# Patient Record
Sex: Female | Born: 1945
Health system: Southern US, Community
[De-identification: ages and names within clinical notes are randomized; demographics above are authoritative.]

## PROBLEM LIST (undated history)

## (undated) DIAGNOSIS — I10 Essential (primary) hypertension: Secondary | ICD-10-CM

## (undated) DIAGNOSIS — T7840XA Allergy, unspecified, initial encounter: Secondary | ICD-10-CM

## (undated) HISTORY — PX: COLONOSCOPY: SHX174

## (undated) HISTORY — DX: Allergy, unspecified, initial encounter: T78.40XA

## (undated) HISTORY — DX: Essential (primary) hypertension: I10

## (undated) HISTORY — PX: POLYPECTOMY: SHX149

---

## 1981-03-06 HISTORY — PX: TUBAL LIGATION: SHX77

## 2011-09-11 ENCOUNTER — Other Ambulatory Visit: Payer: Self-pay | Admitting: Family Medicine

## 2011-09-11 DIAGNOSIS — Z1231 Encounter for screening mammogram for malignant neoplasm of breast: Secondary | ICD-10-CM

## 2011-09-21 ENCOUNTER — Ambulatory Visit
Admission: RE | Admit: 2011-09-21 | Discharge: 2011-09-21 | Disposition: A | Discharge status: Home / Self Care | Payer: Medicare Other | Source: Ambulatory Visit | Attending: Family Medicine | Admitting: Family Medicine

## 2011-09-21 DIAGNOSIS — Z1231 Encounter for screening mammogram for malignant neoplasm of breast: Secondary | ICD-10-CM

## 2011-09-26 ENCOUNTER — Other Ambulatory Visit: Payer: Self-pay | Admitting: Family Medicine

## 2011-09-26 DIAGNOSIS — R928 Other abnormal and inconclusive findings on diagnostic imaging of breast: Secondary | ICD-10-CM

## 2011-10-09 ENCOUNTER — Other Ambulatory Visit: Payer: Self-pay | Admitting: Family Medicine

## 2011-10-09 ENCOUNTER — Ambulatory Visit
Admission: RE | Admit: 2011-10-09 | Discharge: 2011-10-09 | Disposition: A | Discharge status: Home / Self Care | Payer: Medicare Other | Source: Ambulatory Visit | Attending: Family Medicine | Admitting: Family Medicine

## 2011-10-09 DIAGNOSIS — R928 Other abnormal and inconclusive findings on diagnostic imaging of breast: Secondary | ICD-10-CM

## 2011-10-09 DIAGNOSIS — R921 Mammographic calcification found on diagnostic imaging of breast: Secondary | ICD-10-CM

## 2011-10-17 ENCOUNTER — Ambulatory Visit
Admission: RE | Admit: 2011-10-17 | Discharge: 2011-10-17 | Disposition: A | Discharge status: Home / Self Care | Payer: Medicare Other | Source: Ambulatory Visit | Attending: Family Medicine | Admitting: Family Medicine

## 2011-10-17 ENCOUNTER — Other Ambulatory Visit: Payer: Self-pay | Admitting: Family Medicine

## 2011-10-17 DIAGNOSIS — R921 Mammographic calcification found on diagnostic imaging of breast: Secondary | ICD-10-CM

## 2011-10-17 DIAGNOSIS — R928 Other abnormal and inconclusive findings on diagnostic imaging of breast: Secondary | ICD-10-CM

## 2011-10-17 HISTORY — PX: BREAST BIOPSY: SHX20

## 2011-10-25 ENCOUNTER — Encounter: Payer: Self-pay | Admitting: Gastroenterology

## 2011-12-11 ENCOUNTER — Ambulatory Visit (AMBULATORY_SURGERY_CENTER): Payer: Medicare Other | Admitting: *Deleted

## 2011-12-11 VITALS — Ht 59.5 in | Wt 139.0 lb

## 2011-12-11 DIAGNOSIS — Z1211 Encounter for screening for malignant neoplasm of colon: Secondary | ICD-10-CM

## 2011-12-11 MED ORDER — MOVIPREP 100 G PO SOLR: ORAL | Status: DC

## 2011-12-11 NOTE — Progress Notes (Signed)
Patient denies taking ANY medications!

## 2011-12-25 ENCOUNTER — Ambulatory Visit (AMBULATORY_SURGERY_CENTER): Payer: Medicare Other | Admitting: Gastroenterology

## 2011-12-25 ENCOUNTER — Encounter: Payer: Self-pay | Admitting: Gastroenterology

## 2011-12-25 VITALS — BP 125/67 | HR 65 | Temp 97.1°F | Resp 17 | Ht 59.5 in | Wt 139.0 lb

## 2011-12-25 DIAGNOSIS — D126 Benign neoplasm of colon, unspecified: Secondary | ICD-10-CM

## 2011-12-25 DIAGNOSIS — Z1211 Encounter for screening for malignant neoplasm of colon: Secondary | ICD-10-CM

## 2011-12-25 MED ORDER — SODIUM CHLORIDE 0.9 % IV SOLN: 500.0000 mL | INTRAVENOUS | Status: DC

## 2011-12-25 NOTE — Op Note (Signed)
[facility] [address] [place], [address]   COLONOSCOPY PROCEDURE REPORT  PATIENT: Alice Shannon, Alice Shannon  MR#: [id number] BIRTHDATE: 1945-03-28 , 66  yrs. old GENDER: Female ENDOSCOPIST: Meryl Dare, MD, [facility] REFERRED NF:AOZHY Martin, M.D. PROCEDURE DATE:  12/25/2011 PROCEDURE:   Colonoscopy with snare polypectomy ASA CLASS:   Class II INDICATIONS:average risk screening. MEDICATIONS: MAC sedation, administered by CRNA and propofol (Diprivan) 250mg  IV DESCRIPTION OF PROCEDURE:   After the risks benefits and alternatives of the procedure were thoroughly explained, informed consent was obtained.  A digital rectal exam revealed no abnormalities of the rectum.   The LB CF-[id number]  endoscope was introduced through the anus and advanced to the cecum, which was identified by both the appendix and ileocecal valve. No adverse events experienced.   The quality of the prep was excellent, using MoviPrep  The instrument was then slowly withdrawn as the colon was fully examined.   COLON FINDINGS: A sessile polyp measuring 8 mm in size was found in the ascending colon.  A polypectomy was performed with a cold snare.  The resection was complete and the polyp tissue was completely retrieved.   The colon was otherwise normal.  There was no diverticulosis, inflammation, polyps or cancers unless previously stated.  Retroflexed views revealed small internal hemorrhoids. The time to cecum=1 minutes 56 seconds.  Withdrawal time=7 minutes 47 seconds.  The scope was withdrawn and the procedure completed.  COMPLICATIONS: There were no complications.  ENDOSCOPIC IMPRESSION: 1.   Sessile polyp in the ascending colon; polypectomy was performed with a cold snare 2.   Small internal hemorrhoids  RECOMMENDATIONS: 1.  Await pathology results 2.  Repeat colonoscopy in 5 years if polyp adenomatous; otherwise 10 years   eSigned:  Meryl Dare, MD, [facility] 12/25/2011 9:24  AM

## 2011-12-25 NOTE — Progress Notes (Signed)
Patient did not experience any of the following events: a burn prior to discharge; a fall within the facility; wrong site/side/patient/procedure/implant event; or a hospital transfer or hospital admission upon discharge from the facility. (G8907) Patient did not have preoperative order for IV antibiotic SSI prophylaxis. (G8918)  

## 2011-12-25 NOTE — Patient Instructions (Addendum)
Colon polyp x 1 removed and hemorrhoids seen. See handouts. Resume current medications. Call us with any questions or concerns. Thank you!!  YOU HAD AN ENDOSCOPIC PROCEDURE TODAY AT THE [facility]: Refer to the procedure report that was given to you for any specific questions about what was found during the examination.  If the procedure report does not answer your questions, please call your gastroenterologist to clarify.  If you requested that your care partner not be given the details of your procedure findings, then the procedure report has been included in a sealed envelope for you to review at your convenience later.  YOU SHOULD EXPECT: Some feelings of bloating in the abdomen. Passage of more gas than usual.  Walking can help get rid of the air that was put into your GI tract during the procedure and reduce the bloating. If you had a lower endoscopy (such as a colonoscopy or flexible sigmoidoscopy) you may notice spotting of blood in your stool or on the toilet paper. If you underwent a bowel prep for your procedure, then you may not have a normal bowel movement for a few days.  DIET: Your first meal following the procedure should be a light meal and then it is ok to progress to your normal diet.  A half-sandwich or bowl of soup is an example of a good first meal.  Heavy or fried foods are harder to digest and may make you feel nauseous or bloated.  Likewise meals heavy in dairy and vegetables can cause extra gas to form and this can also increase the bloating.  Drink plenty of fluids but you should avoid alcoholic beverages for 24 hours.  ACTIVITY: Your care partner should take you home directly after the procedure.  You should plan to take it easy, moving slowly for the rest of the day.  You can resume normal activity the day after the procedure however you should NOT DRIVE or use heavy machinery for 24 hours (because of the sedation medicines used during the test).    SYMPTOMS TO  REPORT IMMEDIATELY: A gastroenterologist can be reached at any hour.  During normal business hours, 8:30 AM to 5:00 PM Monday through Friday, call [phone].  After hours and on weekends, please call the GI answering service at [phone] who will take a message and have the physician on call contact you.   Following lower endoscopy (colonoscopy or flexible sigmoidoscopy):  Excessive amounts of blood in the stool  Significant tenderness or worsening of abdominal pains  Swelling of the abdomen that is new, acute  Fever of 100F or higher FOLLOW UP: If any biopsies were taken you will be contacted by phone or by letter within the next 1-3 weeks.  Call your gastroenterologist if you have not heard about the biopsies in 3 weeks.  Our staff will call the home number listed on your records the next business day following your procedure to check on you and address any questions or concerns that you may have at that time regarding the information given to you following your procedure. This is a courtesy call and so if there is no answer at the home number and we have not heard from you through the emergency physician on call, we will assume that you have returned to your regular daily activities without incident.  SIGNATURES/CONFIDENTIALITY: You and/or your care partner have signed paperwork which will be entered into your electronic medical record.  These signatures attest to the fact that that  the information above on your After Visit Summary has been reviewed and is understood.  Full responsibility of the confidentiality of this discharge information lies with you and/or your care-partner.

## 2011-12-26 ENCOUNTER — Telehealth: Payer: Self-pay | Admitting: *Deleted

## 2011-12-26 NOTE — Telephone Encounter (Signed)
  Follow up Call-  Call back number 12/25/2011  Post procedure Call Back phone  # [phone] cell  Permission to leave phone message Yes     Patient questions:  Do you have a fever, pain , or abdominal swelling?no Pain Score  0 *  Have you tolerated food without any problems? yes  Have you been able to return to your normal activities? yes  Do you have any questions about your discharge instructions: Diet   no Medications  no Follow up visit  no  Do you have questions or concerns about your Care? no  Actions: * If pain score is 4 or above: No action needed, pain <4.

## 2011-12-28 ENCOUNTER — Encounter: Payer: Self-pay | Admitting: Gastroenterology

## 2012-12-19 ENCOUNTER — Other Ambulatory Visit: Payer: Self-pay

## 2012-12-19 DIAGNOSIS — Z1231 Encounter for screening mammogram for malignant neoplasm of breast: Secondary | ICD-10-CM

## 2013-04-15 ENCOUNTER — Ambulatory Visit
Admission: RE | Admit: 2013-04-15 | Discharge: 2013-04-15 | Disposition: A | Discharge status: Home / Self Care | Payer: Medicare HMO | Source: Ambulatory Visit

## 2013-04-15 ENCOUNTER — Other Ambulatory Visit: Payer: Self-pay

## 2013-04-15 DIAGNOSIS — Z1231 Encounter for screening mammogram for malignant neoplasm of breast: Secondary | ICD-10-CM

## 2014-03-24 ENCOUNTER — Other Ambulatory Visit: Payer: Self-pay

## 2014-03-24 DIAGNOSIS — Z1231 Encounter for screening mammogram for malignant neoplasm of breast: Secondary | ICD-10-CM

## 2014-04-17 ENCOUNTER — Ambulatory Visit
Admission: RE | Admit: 2014-04-17 | Discharge: 2014-04-17 | Disposition: A | Discharge status: Home / Self Care | Payer: Medicare HMO | Source: Ambulatory Visit

## 2014-04-17 ENCOUNTER — Encounter (INDEPENDENT_AMBULATORY_CARE_PROVIDER_SITE_OTHER): Payer: Self-pay

## 2014-04-17 DIAGNOSIS — Z1231 Encounter for screening mammogram for malignant neoplasm of breast: Secondary | ICD-10-CM

## 2015-04-05 ENCOUNTER — Other Ambulatory Visit: Payer: Self-pay

## 2015-04-05 DIAGNOSIS — Z1231 Encounter for screening mammogram for malignant neoplasm of breast: Secondary | ICD-10-CM

## 2015-04-23 ENCOUNTER — Ambulatory Visit
Admission: RE | Admit: 2015-04-23 | Discharge: 2015-04-23 | Disposition: A | Discharge status: Home / Self Care | Payer: Medicare HMO | Source: Ambulatory Visit

## 2015-04-23 DIAGNOSIS — Z1231 Encounter for screening mammogram for malignant neoplasm of breast: Secondary | ICD-10-CM

## 2016-01-17 ENCOUNTER — Other Ambulatory Visit: Payer: Self-pay | Admitting: Family Medicine

## 2016-01-17 DIAGNOSIS — Z1231 Encounter for screening mammogram for malignant neoplasm of breast: Secondary | ICD-10-CM

## 2016-05-08 ENCOUNTER — Ambulatory Visit
Admission: RE | Admit: 2016-05-08 | Discharge: 2016-05-08 | Disposition: A | Discharge status: Home / Self Care | Payer: Medicare HMO | Source: Ambulatory Visit | Attending: Family Medicine | Admitting: Family Medicine

## 2016-05-08 DIAGNOSIS — Z1231 Encounter for screening mammogram for malignant neoplasm of breast: Secondary | ICD-10-CM

## 2017-01-01 ENCOUNTER — Encounter: Payer: Self-pay | Admitting: Gastroenterology

## 2017-03-30 ENCOUNTER — Other Ambulatory Visit: Payer: Self-pay | Admitting: Family Medicine

## 2017-03-30 DIAGNOSIS — Z1231 Encounter for screening mammogram for malignant neoplasm of breast: Secondary | ICD-10-CM

## 2017-05-10 ENCOUNTER — Ambulatory Visit
Admission: RE | Admit: 2017-05-10 | Discharge: 2017-05-10 | Disposition: A | Discharge status: Home / Self Care | Payer: Medicare HMO | Source: Ambulatory Visit | Attending: Family Medicine | Admitting: Family Medicine

## 2017-05-10 DIAGNOSIS — Z1231 Encounter for screening mammogram for malignant neoplasm of breast: Secondary | ICD-10-CM

## 2017-05-11 ENCOUNTER — Other Ambulatory Visit: Payer: Self-pay | Admitting: Family Medicine

## 2017-05-11 DIAGNOSIS — R921 Mammographic calcification found on diagnostic imaging of breast: Secondary | ICD-10-CM

## 2017-05-15 ENCOUNTER — Ambulatory Visit
Admission: RE | Admit: 2017-05-15 | Discharge: 2017-05-15 | Disposition: A | Discharge status: Home / Self Care | Payer: Medicare HMO | Source: Ambulatory Visit | Attending: Family Medicine | Admitting: Family Medicine

## 2017-05-15 DIAGNOSIS — R921 Mammographic calcification found on diagnostic imaging of breast: Secondary | ICD-10-CM

## 2018-03-14 ENCOUNTER — Other Ambulatory Visit: Payer: Self-pay | Admitting: Internal Medicine

## 2018-03-14 ENCOUNTER — Other Ambulatory Visit (HOSPITAL_COMMUNITY)
Admission: RE | Admit: 2018-03-14 | Discharge: 2018-03-14 | Disposition: A | Discharge status: Home / Self Care | Payer: Medicare Other | Source: Ambulatory Visit | Attending: Internal Medicine | Admitting: Internal Medicine

## 2018-03-14 DIAGNOSIS — Z01411 Encounter for gynecological examination (general) (routine) with abnormal findings: Secondary | ICD-10-CM | POA: Diagnosis present

## 2018-03-14 DIAGNOSIS — E2839 Other primary ovarian failure: Secondary | ICD-10-CM

## 2018-03-19 LAB — CYTOLOGY - PAP
Diagnosis: NEGATIVE
HPV: NOT DETECTED

## 2018-04-01 ENCOUNTER — Other Ambulatory Visit: Payer: Self-pay | Admitting: Internal Medicine

## 2018-04-01 ENCOUNTER — Other Ambulatory Visit: Payer: Self-pay | Admitting: Family Medicine

## 2018-04-01 DIAGNOSIS — Z1231 Encounter for screening mammogram for malignant neoplasm of breast: Secondary | ICD-10-CM

## 2018-04-02 ENCOUNTER — Encounter: Payer: Self-pay | Admitting: Gastroenterology

## 2018-04-17 ENCOUNTER — Other Ambulatory Visit: Payer: Self-pay

## 2018-04-17 ENCOUNTER — Encounter: Payer: Self-pay | Admitting: Gastroenterology

## 2018-04-17 ENCOUNTER — Ambulatory Visit (AMBULATORY_SURGERY_CENTER): Payer: Self-pay | Admitting: *Deleted

## 2018-04-17 VITALS — Ht 59.5 in | Wt 143.8 lb

## 2018-04-17 DIAGNOSIS — Z860100 Personal history of colon polyps, unspecified: Secondary | ICD-10-CM

## 2018-04-17 MED ORDER — SUPREP BOWEL PREP KIT 17.5-3.13-1.6 GM/177ML PO SOLN: 1.0000 | Freq: Once | ORAL | 0 refills | Status: AC

## 2018-04-17 NOTE — Progress Notes (Signed)
No egg or soy allergy known to patient  No issues with past sedation with any surgeries  or procedures, no intubation problems  No diet pills per patient No home 02 use per patient  No blood thinners per patient  Pt denies issues with constipation  No A fib or A flutter  EMMI video  Offered and declined by the patient. 

## 2018-04-26 ENCOUNTER — Other Ambulatory Visit: Payer: Medicare HMO

## 2018-05-01 ENCOUNTER — Ambulatory Visit (AMBULATORY_SURGERY_CENTER): Payer: Medicare Other | Admitting: Gastroenterology

## 2018-05-01 ENCOUNTER — Encounter: Payer: Self-pay | Admitting: Gastroenterology

## 2018-05-01 VITALS — BP 150/75 | HR 72 | Temp 98.6°F | Resp 15 | Ht 59.0 in | Wt 143.0 lb

## 2018-05-01 DIAGNOSIS — D125 Benign neoplasm of sigmoid colon: Secondary | ICD-10-CM

## 2018-05-01 DIAGNOSIS — K635 Polyp of colon: Secondary | ICD-10-CM

## 2018-05-01 DIAGNOSIS — Z86010 Personal history of colonic polyps: Secondary | ICD-10-CM | POA: Diagnosis not present

## 2018-05-01 DIAGNOSIS — D123 Benign neoplasm of transverse colon: Secondary | ICD-10-CM

## 2018-05-01 MED ORDER — SODIUM CHLORIDE 0.9 % IV SOLN: 500.0000 mL | Freq: Once | INTRAVENOUS | Status: DC

## 2018-05-01 NOTE — Patient Instructions (Signed)
Handout on polyps, hemorrhoids, diverticulosis given.   YOU HAD AN ENDOSCOPIC PROCEDURE TODAY AT [facility]:   Refer to the procedure report that was given to you for any specific questions about what was found during the examination.  If the procedure report does not answer your questions, please call your gastroenterologist to clarify.  If you requested that your care partner not be given the details of your procedure findings, then the procedure report has been included in a sealed envelope for you to review at your convenience later.  YOU SHOULD EXPECT: Some feelings of bloating in the abdomen. Passage of more gas than usual.  Walking can help get rid of the air that was put into your GI tract during the procedure and reduce the bloating. If you had a lower endoscopy (such as a colonoscopy or flexible sigmoidoscopy) you may notice spotting of blood in your stool or on the toilet paper. If you underwent a bowel prep for your procedure, you may not have a normal bowel movement for a few days.  Please Note:  You might notice some irritation and congestion in your nose or some drainage.  This is from the oxygen used during your procedure.  There is no need for concern and it should clear up in a day or so.  SYMPTOMS TO REPORT IMMEDIATELY:   Following lower endoscopy (colonoscopy or flexible sigmoidoscopy):  Excessive amounts of blood in the stool  Significant tenderness or worsening of abdominal pains  Swelling of the abdomen that is new, acute  Fever of 100F or higher   For urgent or emergent issues, a gastroenterologist can be reached at any hour by calling [phone].   DIET:  We do recommend a small meal at first, but then you may proceed to your regular diet.  Drink plenty of fluids but you should avoid alcoholic beverages for 24 hours.  ACTIVITY:  You should plan to take it easy for the rest of today and you should NOT DRIVE or use heavy machinery until tomorrow  (because of the sedation medicines used during the test).    FOLLOW UP: Our staff will call the number listed on your records the next business day following your procedure to check on you and address any questions or concerns that you may have regarding the information given to you following your procedure. If we do not reach you, we will leave a message.  However, if you are feeling well and you are not experiencing any problems, there is no need to return our call.  We will assume that you have returned to your regular daily activities without incident.  If any biopsies were taken you will be contacted by phone or by letter within the next 1-3 weeks.  Please call us at [phone] if you have not heard about the biopsies in 3 weeks.    SIGNATURES/CONFIDENTIALITY: You and/or your care partner have signed paperwork which will be entered into your electronic medical record.  These signatures attest to the fact that that the information above on your After Visit Summary has been reviewed and is understood.  Full responsibility of the confidentiality of this discharge information lies with you and/or your care-partner.

## 2018-05-01 NOTE — Progress Notes (Signed)
Pt's states no medical or surgical changes since previsit or office visit. 

## 2018-05-01 NOTE — Progress Notes (Signed)
To PACU, VSS. REport to RN.tb 

## 2018-05-01 NOTE — Op Note (Signed)
[place] Patient Name: Alice Shannon Procedure Date: 05/01/2018 2:00 PM MRN: [id number] Endoscopist: Ladene Artist , MD Age: 73 Referring MD:  Date of Birth: Sep 11, 1945 Gender: Female Account #: [id number] Procedure:                Colonoscopy Indications:              Surveillance: Personal history of adenomatous                            polyps on last colonoscopy > 5 years ago Medicines:                Monitored Anesthesia Care Procedure:                Pre-Anesthesia Assessment:                           - Prior to the procedure, a History and Physical                            was performed, and patient medications and                            allergies were reviewed. The patient's tolerance of                            previous anesthesia was also reviewed. The risks                            and benefits of the procedure and the sedation                            options and risks were discussed with the patient.                            All questions were answered, and informed consent                            was obtained. Prior Anticoagulants: The patient has                            taken no previous anticoagulant or antiplatelet                            agents. ASA Grade Assessment: II - A patient with                            mild systemic disease. After reviewing the risks                            and benefits, the patient was deemed in                            satisfactory condition to undergo the procedure.  After obtaining informed consent, the colonoscope                            was passed under direct vision. Throughout the                            procedure, the patient's blood pressure, pulse, and                            oxygen saturations were monitored continuously. The                            Model CF-HQ190L [id number] scope was introduced                            through the anus and  advanced to the the cecum,                            identified by appendiceal orifice and ileocecal                            valve. The ileocecal valve, appendiceal orifice,                            and rectum were photographed. The quality of the                            bowel preparation was good. The colonoscopy was                            performed without difficulty. The patient tolerated                            the procedure well. Scope In: 2:08:48 PM Scope Out: 2:21:16 PM Scope Withdrawal Time: 0 hours 10 minutes 16 seconds  Total Procedure Duration: 0 hours 12 minutes 28 seconds  Findings:                 The perianal and digital rectal examinations were                            normal.                           Two sessile polyps were found in the sigmoid colon                            and splenic flexure. The polyps were 6 to 7 mm in                            size. These polyps were removed with a cold snare.                            Resection and retrieval were complete.  A few small-mouthed diverticula were found in the                            sigmoid colon.                           Internal hemorrhoids were found during                            retroflexion. The hemorrhoids were small and Grade                            I (internal hemorrhoids that do not prolapse).                           The exam was otherwise without abnormality on                            direct and retroflexion views. Complications:            No immediate complications. Estimated blood loss:                            None. Estimated Blood Loss:     Estimated blood loss: none. Impression:               - Two 6 to 7 mm polyps in the sigmoid colon and at                            the splenic flexure, removed with a cold snare.                            Resected and retrieved.                           - Diverticulosis in the sigmoid colon.                            - Internal hemorrhoids.                           - The examination was otherwise normal on direct                            and retroflexion views. Recommendation:           - Repeat colonoscopy in 5 years for surveillance.                           - Patient has a contact number available for                            emergencies. The signs and symptoms of potential                            delayed complications were discussed with the  patient. Return to normal activities tomorrow.                            Written discharge instructions were provided to the                            patient.                           - Resume previous diet.                           - Continue present medications.                           - Await pathology results. Ladene Artist, MD 05/01/2018 2:24:25 PM This report has been signed electronically.

## 2018-05-02 ENCOUNTER — Encounter: Payer: Self-pay | Admitting: Podiatry

## 2018-05-02 ENCOUNTER — Ambulatory Visit: Payer: Medicare Other | Admitting: Podiatry

## 2018-05-02 ENCOUNTER — Telehealth: Payer: Self-pay | Admitting: *Deleted

## 2018-05-02 VITALS — BP 159/78 | HR 70

## 2018-05-02 DIAGNOSIS — M79672 Pain in left foot: Secondary | ICD-10-CM | POA: Diagnosis not present

## 2018-05-02 DIAGNOSIS — M216X2 Other acquired deformities of left foot: Secondary | ICD-10-CM

## 2018-05-02 NOTE — Telephone Encounter (Signed)
  Follow up Call-  Call back number 05/01/2018  Post procedure Call Back phone  # [phone] cell  Permission to leave phone message Yes  Some recent data might be hidden     Patient questions:  Do you have a fever, pain , or abdominal swelling? No. Pain Score  0 *  Have you tolerated food without any problems? Yes.    Have you been able to return to your normal activities? Yes.    Do you have any questions about your discharge instructions: Diet   No. Medications  No. Follow up visit  No.  Do you have questions or concerns about your Care? No.  Actions: * If pain score is 4 or above: No action needed, pain <4.

## 2018-05-02 NOTE — Patient Instructions (Addendum)
Foot Pain  Many things can cause foot pain. Some common causes are:   An injury.   A sprain.   Arthritis.   Blisters.   Bunions.  Follow these instructions at home:  Pay attention to any changes in your symptoms. Take these actions to help with your discomfort:   If directed, put ice on the affected area:  ? Put ice in a plastic bag.  ? Place a towel between your skin and the bag.  ? Leave the ice on for 15-20 minutes, 3?4 times a day for 2 days.   Take over-the-counter and prescription medicines only as told by your health care provider.   Wear comfortable, supportive shoes that fit you well. Do not wear high heels.   Do not stand or walk for long periods of time.   Do not lift a lot of weight. This can put added pressure on your feet.   Do stretches to relieve foot pain and stiffness as told by your health care provider.   Rub your foot gently.   Keep your feet clean and dry.  Contact a health care provider if:   Your pain does not get better after a few days of self-care.   Your pain gets worse.   You cannot stand on your foot.  Get help right away if:   Your foot is numb or tingling.   Your foot or toes are swollen.   Your foot or toes turn white or blue.   You have warmth and redness along your foot.  This information is not intended to replace advice given to you by your health care provider. Make sure you discuss any questions you have with your health care provider.  Document Released: 03/19/2015 Document Revised: 07/29/2015 Document Reviewed: 03/18/2014  Elsevier Interactive Patient Education  2019 [employer]

## 2018-05-09 ENCOUNTER — Encounter: Payer: Self-pay | Admitting: Gastroenterology

## 2018-05-11 NOTE — Progress Notes (Signed)
Subjective: Alice Shannon presents today referred by Lanice Shirts, MD with cc of painful plantar left foot callus. She states she just retired at age 73 and wore steel toed shoes for work for many years.  Duration of her pain symptoms is about 7 years.  She has been shaving them herself in the past.  Pain is aggravated when weightbearing with and without wearing shoe gear.    Past Medical History:  Diagnosis Date  . Allergy   . Hypertension      There are no active problems to display for this patient.    Past Surgical History:  Procedure Laterality Date  . BREAST BIOPSY Right 10/17/2011   stereo/benign  . COLONOSCOPY    . POLYPECTOMY    . TUBAL LIGATION  1983      Current Outpatient Medications:  .  cetirizine (ZYRTEC) 10 MG tablet, Take 10 mg by mouth daily., Disp: , Rfl:  .  cloNIDine (CATAPRES) 0.1 MG tablet, Take 0.1 mg by mouth 2 (two) times daily., Disp: , Rfl:  .  Vitamin D, Ergocalciferol, (DRISDOL) 1.25 MG (50000 UT) CAPS capsule, TAKE 1 CAPSULE 1 TIME A WEEK FOR 12 WEEKS, Disp: , Rfl:    No Known Allergies   Social History   Occupational History  . Not on file  Tobacco Use  . Smoking status: Never Smoker  . Smokeless tobacco: Never Used  Substance and Sexual Activity  . Alcohol use: No  . Drug use: No  . Sexual activity: Not on file     Family History  Problem Relation Age of Onset  . Colon cancer Neg Hx   . Esophageal cancer Neg Hx   . Rectal cancer Neg Hx   . Stomach cancer Neg Hx   . Colon polyps Neg Hx       There is no immunization history on file for this patient.   Review of systems: Positive Findings in bold print.  Constitutional:  chills, fatigue, fever, sweats, weight change Communication: Optometrist, sign Ecologist, hand writing, iPad/Android device Head: headaches, head injury Eyes: changes in vision, eye pain, glaucoma, cataracts, macular degeneration, diplopia, glare,  light sensitivity, eyeglasses  or contacts, blindness Ears nose mouth throat: Hard of hearing, ringing in ears, deaf, sign language,  vertigo,   nosebleeds,  rhinitis,  cold sores, snoring, swollen glands Cardiovascular: HTN, edema, arrhythmia, pacemaker in place, defibrillator in place,  chest pain/tightness, chronic anticoagulation, blood clot, heart failure Peripheral Vascular: leg cramps, varicose veins, blood clots, lymphedema Respiratory:  difficulty breathing, denies congestion, SOB, wheezing, cough, emphysema Gastrointestinal: change in appetite or weight, abdominal pain, constipation, diarrhea, nausea, vomiting, vomiting blood, change in bowel habits, abdominal pain, jaundice, rectal bleeding, hemorrhoids, Genitourinary:  nocturia,  pain on urination,  blood in urine, Foley catheter, urinary urgency Musculoskeletal: uses mobility aid,  cramping, stiff joints, painful joints, decreased joint motion, fractures, OA, gout Skin: changes in toenails, color change, dryness, itching, mole changes,  rash  Neurological: headaches, numbness in feet, paresthesias in feet, burning in feet, fainting,  seizures, change in speech. denies headaches, memory problems/poor historian, cerebral palsy, weakness, paralysis Endocrine: diabetes, hypothyroidism, hyperthyroidism,  goiter, dry mouth, flushing, heat intolerance,  cold intolerance,  excessive thirst, denies polyuria,  nocturia Hematological:  easy bleeding, excessive bleeding, easy bruising, enlarged lymph nodes, on long term blood thinner, history of past transusions Allergy/immunological:  hives, eczema, frequent infections, multiple drug allergies, seasonal allergies, transplant recipient Psychiatric:  anxiety, depression, mood disorder, suicidal ideations, hallucinations  Objective: Vascular Examination: Capillary refill time immediate x 10 digits.  Dorsalis pedis and posterior tibial pulses present b/l.  Sparse digital hair x 10 digits.  Skin temperature gradient WNL  b/l.  Dermatological Examination: Skin with normal turgor, texture and tone b/l.  Toenails 1-5 b/l well manicured and adequate length.  Hyperkeratotic lesion noted submetatarsal head 3 left foot and subhallux IPJ left hallux.  Musculoskeletal: Muscle strength 5/5 to all LE muscle groups  Plantarflexed metatarsal 3rd left foot  Neurological: Sensation intact with 10 gram monofilament Vibratory sensation intact.  Assessment: 1. Plantarflexed metatarsal left 3rd digit 2. Callus submet head 3 left foot, left hallux 3. Pain in foot   Plan: 1. Discussed diagoses and conservative/surgical treatment options. Lesions pared as a courtesy today.  Literature dispensed on today. 2. Patient to continue soft, supportive shoe gear 3. Patient to report any pedal injuries to medical professional immediately. 4. Follow up 3 months.  5. Patient/POA to call should there be a concern in the interim.

## 2018-05-13 ENCOUNTER — Ambulatory Visit: Payer: Medicare HMO

## 2018-05-29 ENCOUNTER — Ambulatory Visit: Payer: Medicare HMO

## 2018-05-29 ENCOUNTER — Other Ambulatory Visit: Payer: Medicare HMO

## 2018-07-31 ENCOUNTER — Other Ambulatory Visit: Payer: Self-pay

## 2018-07-31 ENCOUNTER — Ambulatory Visit: Payer: Medicare Other

## 2018-08-05 ENCOUNTER — Ambulatory Visit: Payer: Medicare Other | Admitting: Podiatry

## 2018-09-26 ENCOUNTER — Other Ambulatory Visit: Payer: Self-pay

## 2018-09-26 ENCOUNTER — Ambulatory Visit
Admission: RE | Admit: 2018-09-26 | Discharge: 2018-09-26 | Disposition: A | Discharge status: Home / Self Care | Payer: Medicare Other | Source: Ambulatory Visit | Attending: Internal Medicine | Admitting: Internal Medicine

## 2018-09-26 DIAGNOSIS — Z1231 Encounter for screening mammogram for malignant neoplasm of breast: Secondary | ICD-10-CM

## 2018-09-26 DIAGNOSIS — E2839 Other primary ovarian failure: Secondary | ICD-10-CM

## 2018-10-25 ENCOUNTER — Other Ambulatory Visit: Payer: Self-pay

## 2018-10-25 DIAGNOSIS — Z20822 Contact with and (suspected) exposure to covid-19: Secondary | ICD-10-CM

## 2018-10-26 LAB — NOVEL CORONAVIRUS, NAA: SARS-CoV-2, NAA: NOT DETECTED

## 2019-05-06 MED FILL — VIT D2 1.25 MG (50,000 UNIT: 1.25 MG | 56 days supply | Qty: 8 | Fill #0

## 2019-05-09 ENCOUNTER — Other Ambulatory Visit: Payer: Self-pay | Admitting: Internal Medicine

## 2019-05-09 DIAGNOSIS — Z1231 Encounter for screening mammogram for malignant neoplasm of breast: Secondary | ICD-10-CM

## 2019-09-08 ENCOUNTER — Observation Stay (HOSPITAL_COMMUNITY)
Admission: EM | Admit: 2019-09-08 | Discharge: 2019-09-11 | Disposition: A | Discharge status: Home / Self Care | Payer: Medicare Other | Attending: Internal Medicine | Admitting: Internal Medicine

## 2019-09-08 ENCOUNTER — Emergency Department (HOSPITAL_COMMUNITY): Payer: Medicare Other

## 2019-09-08 ENCOUNTER — Other Ambulatory Visit: Payer: Self-pay

## 2019-09-08 DIAGNOSIS — R42 Dizziness and giddiness: Secondary | ICD-10-CM | POA: Diagnosis present

## 2019-09-08 DIAGNOSIS — Z79899 Other long term (current) drug therapy: Secondary | ICD-10-CM | POA: Diagnosis not present

## 2019-09-08 DIAGNOSIS — I10 Essential (primary) hypertension: Secondary | ICD-10-CM | POA: Diagnosis not present

## 2019-09-08 DIAGNOSIS — H8110 Benign paroxysmal vertigo, unspecified ear: Secondary | ICD-10-CM | POA: Diagnosis present

## 2019-09-08 LAB — DIFFERENTIAL
Abs Immature Granulocytes: 0.02 K/uL (ref 0.00–0.07)
Basophils Absolute: 0 K/uL (ref 0.0–0.1)
Basophils Relative: 0 %
Eosinophils Absolute: 0 K/uL (ref 0.0–0.5)
Eosinophils Relative: 0 %
Immature Granulocytes: 0 %
Lymphocytes Relative: 12 %
Lymphs Abs: 1.1 K/uL (ref 0.7–4.0)
Monocytes Absolute: 0.4 K/uL (ref 0.1–1.0)
Monocytes Relative: 4 %
Neutro Abs: 8 K/uL — ABNORMAL HIGH (ref 1.7–7.7)
Neutrophils Relative %: 84 %

## 2019-09-08 LAB — COMPREHENSIVE METABOLIC PANEL WITH GFR
ALT: 23 U/L (ref 0–44)
AST: 28 U/L (ref 15–41)
Albumin: 4.5 g/dL (ref 3.5–5.0)
Alkaline Phosphatase: 89 U/L (ref 38–126)
Anion gap: 11 (ref 5–15)
BUN: 12 mg/dL (ref 8–23)
CO2: 26 mmol/L (ref 22–32)
Calcium: 9.5 mg/dL (ref 8.9–10.3)
Chloride: 105 mmol/L (ref 98–111)
Creatinine, Ser: 0.9 mg/dL (ref 0.44–1.00)
GFR calc Af Amer: >60 mL/min
GFR calc non Af Amer: >60 mL/min
Glucose, Bld: 138 mg/dL — ABNORMAL HIGH (ref 70–99)
Potassium: 3.9 mmol/L (ref 3.5–5.1)
Sodium: 142 mmol/L (ref 135–145)
Total Bilirubin: 0.6 mg/dL (ref 0.3–1.2)
Total Protein: 8.5 g/dL — ABNORMAL HIGH (ref 6.5–8.1)

## 2019-09-08 LAB — CBC
HCT: 44 % (ref 36.0–46.0)
Hemoglobin: 14.5 g/dL (ref 12.0–15.0)
MCH: 30.8 pg (ref 26.0–34.0)
MCHC: 33 g/dL (ref 30.0–36.0)
MCV: 93.4 fL (ref 80.0–100.0)
Platelets: 226 K/uL (ref 150–400)
RBC: 4.71 MIL/uL (ref 3.87–5.11)
RDW: 12.9 % (ref 11.5–15.5)
WBC: 9.6 K/uL (ref 4.0–10.5)
nRBC: 0 % (ref 0.0–0.2)

## 2019-09-08 LAB — PROTIME-INR
INR: 1.1 (ref 0.8–1.2)
Prothrombin Time: 13.6 seconds (ref 11.4–15.2)

## 2019-09-08 LAB — APTT: aPTT: <24 s — ABNORMAL LOW (ref 24–36)

## 2019-09-08 LAB — ETHANOL: Alcohol, Ethyl (B): <10 mg/dL

## 2019-09-08 MED ORDER — MECLIZINE HCL 25 MG PO TABS: 12.5000 mg | ORAL_TABLET | Freq: Once | ORAL | Status: DC

## 2019-09-08 MED ORDER — LORAZEPAM 2 MG/ML IJ SOLN: 0.5000 mg | Freq: Once | INTRAMUSCULAR | Status: DC

## 2019-09-08 MED ORDER — LORAZEPAM 2 MG/ML IJ SOLN
1.0000 mg | Freq: Once | INTRAMUSCULAR | Status: AC
  Administered 2019-09-08: 1 mg via INTRAVENOUS
  Filled 2019-09-08: qty 1

## 2019-09-08 MED ORDER — SODIUM CHLORIDE (PF) 0.9 % IJ SOLN
INTRAMUSCULAR | Status: AC
  Administered 2019-09-08: 10 mL
  Filled 2019-09-08: qty 50

## 2019-09-08 MED ORDER — IOHEXOL 350 MG/ML SOLN
100.0000 mL | Freq: Once | INTRAVENOUS | Status: AC | PRN
  Administered 2019-09-08: 100 mL via INTRAVENOUS

## 2019-09-08 MED ORDER — ONDANSETRON HCL 4 MG/2ML IJ SOLN
4.0000 mg | Freq: Once | INTRAMUSCULAR | Status: AC
  Administered 2019-09-08: 4 mg via INTRAVENOUS
  Filled 2019-09-08: qty 2

## 2019-09-08 NOTE — ED Provider Notes (Signed)
Sudden vertiginous symptoms Code stroke? CTA/MRI ordered/pending Given ativan and zofran - needs re-eval In MRI now, CTA needs to be done H/O HTN only  1:10 - CTA and MRI are negative for cause of dizziness. Recheck: patient reports she is improved, however, symptoms become significant again with simply turning her head side-to-side. Sitting up/ambulation not yet attempted. Will add additional medications. If no significant improvement may consider admission for symptom control given degree of dizziness and age.   2:10 - Patient has received Meclizine and Valium and on recheck continues to be symptomatic. She attempted to ambulate without success. Feel she will require admission for continued treatment until stable for discharge home. Hospitalist paged for admission.     Charlann Lange, PA-C 09/09/19 0212    Drenda Freeze, MD 09/11/19 [phone]

## 2019-09-08 NOTE — ED Triage Notes (Signed)
Patient brought in by EMS for episode of nausea with vomiting and dizziness after eating meal at home. CBG is 141. Patient with history of vertigo, no current meds.

## 2019-09-08 NOTE — ED Provider Notes (Addendum)
[place] DEPT Provider Note   CSN: [id number] Arrival date & time: 09/08/19  2019     History Chief Complaint  Patient presents with  . Nausea  . Emesis    Alice Shannon is a 74 y.o. female w PMHx HTN, presenting to the ED with complaint of sudden onset of room-spinning dizziness that began at 6pm. She has associated nausea and vomiting, worse with position changes. She states she has no vision loss but large difficulty focusing her eyes.  She also has problems balancing.  She denies HA, weakness, numbness.  Patient's husband states she has not had any slurred speech or facial droop.  She states she has never had vertigo before.  The history is provided by the patient.       Past Medical History:  Diagnosis Date  . Allergy   . Hypertension     There are no problems to display for this patient.   Past Surgical History:  Procedure Laterality Date  . BREAST BIOPSY Right 10/17/2011   stereo/benign  . COLONOSCOPY    . POLYPECTOMY    . TUBAL LIGATION  1983     OB History   No obstetric history on file.     Family History  Problem Relation Age of Onset  . Colon cancer Neg Hx   . Esophageal cancer Neg Hx   . Rectal cancer Neg Hx   . Stomach cancer Neg Hx   . Colon polyps Neg Hx     Social History   Tobacco Use  . Smoking status: Never Smoker  . Smokeless tobacco: Never Used  Vaping Use  . Vaping Use: Never used  Substance Use Topics  . Alcohol use: No  . Drug use: No    Home Medications Prior to Admission medications   Medication Sig Start Date End Date Taking? Authorizing Provider  cetirizine (ZYRTEC) 10 MG tablet Take 10 mg by mouth daily.   Yes [provider]  cloNIDine (CATAPRES) 0.1 MG tablet Take 0.1 mg by mouth 2 (two) times daily.    [provider]    Allergies    Patient has no known allergies.  Review of Systems   Review of Systems  All other systems reviewed and are  negative.   Physical Exam Updated Vital Signs BP (!) 167/82   Pulse 68   Temp 97.7 F (36.5 C)   Resp 20   Ht 4\' 11"  (1.499 m)   Wt 63.5 kg   SpO2 100%   BMI 28.28 kg/m   Physical Exam Vitals and nursing note reviewed.  Constitutional:      Appearance: She is well-developed.  HENT:     Head: Normocephalic and atraumatic.  Eyes:     Conjunctiva/sclera: Conjunctivae normal.  Cardiovascular:     Rate and Rhythm: Normal rate and regular rhythm.  Pulmonary:     Effort: Pulmonary effort is normal. No respiratory distress.     Breath sounds: Normal breath sounds.  Abdominal:     Palpations: Abdomen is soft.  Skin:    General: Skin is warm.  Neurological:     Mental Status: She is alert.     Comments: Patient is laying to the left side with her eyes closed.  Mental Status:  Alert, oriented, thought content appropriate, able to give a coherent history. Speech fluent without evidence of aphasia. Able to follow 2 step commands without difficulty.  Cranial Nerves:  II:  Peripheral visual fields grossly normal, pupils equal,  round, reactive to light III,IV, VI: ptosis not present, extra-ocular motions intact bilaterally - has resting horizontal nystagmus that persists.   V,VII: smile symmetric, facial light touch sensation equal VIII: hearing grossly normal to voice  X: uvula elevates symmetrically  XI: bilateral shoulder shrug symmetric and strong XII: midline tongue extension without fassiculations Motor:  Normal tone. 5/5 strength in upper and lower extremities bilaterally including strong and equal grip strength and dorsiflexion/plantar flexion Sensory: grossly normal in all extremities.  Cerebellar: normal finger-to-nose with bilateral upper extremities CV: distal pulses palpable throughout   Gait and balance deferred given patient's significant dizziness and large nausea with any position change.  Psychiatric:        Behavior: Behavior normal.     ED Results /  Procedures / Treatments   Labs (all labs ordered are listed, but only abnormal results are displayed) Labs Reviewed  APTT - Abnormal; Notable for the following components:      Result Value   aPTT <24 (*)    All other components within normal limits  DIFFERENTIAL - Abnormal; Notable for the following components:   Neutro Abs 8.0 (*)    All other components within normal limits  COMPREHENSIVE METABOLIC PANEL - Abnormal; Notable for the following components:   Glucose, Bld 138 (*)    Total Protein 8.5 (*)    All other components within normal limits  ETHANOL  PROTIME-INR  CBC  RAPID URINE DRUG SCREEN, HOSP PERFORMED  URINALYSIS, ROUTINE W REFLEX MICROSCOPIC  I-STAT CHEM 8, ED    EKG None  Radiology No results found.  Procedures Procedures (including critical care time)  Medications Ordered in ED Medications  ondansetron (ZOFRAN) injection 4 mg (4 mg Intravenous Given 09/08/19 2157)  LORazepam (ATIVAN) injection 1 mg (1 mg Intravenous Given 09/08/19 2157)  sodium chloride (PF) 0.9 % injection (10 mLs  Given 09/08/19 2250)    ED Course  I have reviewed the triage vital signs and the nursing notes.  Pertinent labs & imaging results that were available during my care of the patient were reviewed by me and considered in my medical decision making (see chart for details).  Clinical Course as of Sep 08 2303  Mon Sep 08, 2019  2100 Pt evaluated. Concern for vertiginous symptoms as central cause. Pt discussed with Dr. Darl Householder, who evaluated pt. CTA head and neck ordered, MRI brain to evaluate posterior circulation.    [JR]    Clinical Course User Index [JR] Harini Dearmond, [place] N, PA-C   MDM Rules/Calculators/A&P                          Patient presenting to the ED with sudden onset of vertiginous symptoms that began at 6 PM this evening.  She has room spinning dizziness, nausea, vomiting, worse with position changes.  She also unable to stand due to balance problem.  On exam, she has  a resting and nystagmus present.  Gait deferred due to dizziness however she has no other obvious focal neuro deficits.  Concern for posterior circulation stroke, differential also includes peripheral vertigo. Dr. Darl Householder evaluated patient as well, patient sent to MRI prior to CTA due to MRI technologists leaving shortly.  Care assumed at shift change by PA Upstill, pending MRI, CT imaging and lab results.  If imaging is all negative, patient will need reevaluation for symptom improvement, will need to ambulate.  Final Clinical Impression(s) / ED Diagnoses Final diagnoses:  None  Rx / DC Orders ED Discharge Orders    None       Zakry Caso, [place] N, PA-C 09/08/19 2322    Rockwell Zentz, [place] N, PA-C 09/08/19 2324    Drenda Freeze, MD 09/09/19 [phone]

## 2019-09-09 ENCOUNTER — Encounter (HOSPITAL_COMMUNITY): Payer: Self-pay | Admitting: Internal Medicine

## 2019-09-09 DIAGNOSIS — H8110 Benign paroxysmal vertigo, unspecified ear: Secondary | ICD-10-CM

## 2019-09-09 LAB — URINALYSIS, ROUTINE W REFLEX MICROSCOPIC
Bilirubin Urine: NEGATIVE
Glucose, UA: NEGATIVE mg/dL
Hgb urine dipstick: NEGATIVE
Ketones, ur: 20 mg/dL — AB
Leukocytes,Ua: NEGATIVE
Nitrite: NEGATIVE
Protein, ur: NEGATIVE mg/dL
Specific Gravity, Urine: >1.046 — ABNORMAL HIGH (ref 1.005–1.030)
pH: 7 (ref 5.0–8.0)

## 2019-09-09 LAB — RAPID URINE DRUG SCREEN, HOSP PERFORMED
Amphetamines: NOT DETECTED
Barbiturates: NOT DETECTED
Benzodiazepines: NOT DETECTED
Cocaine: NOT DETECTED
Opiates: NOT DETECTED
Tetrahydrocannabinol: NOT DETECTED

## 2019-09-09 MED ORDER — MECLIZINE HCL 25 MG PO TABS
25.0000 mg | ORAL_TABLET | Freq: Once | ORAL | Status: AC
  Administered 2019-09-09: 25 mg via ORAL
  Filled 2019-09-09: qty 1

## 2019-09-09 MED ORDER — ACETAMINOPHEN 650 MG RE SUPP: 650.0000 mg | Freq: Four times a day (QID) | RECTAL | Status: DC | PRN

## 2019-09-09 MED ORDER — ONDANSETRON HCL 4 MG/2ML IJ SOLN: 4.0000 mg | Freq: Four times a day (QID) | INTRAMUSCULAR | Status: DC | PRN

## 2019-09-09 MED ORDER — MECLIZINE HCL 25 MG PO TABS
25.0000 mg | ORAL_TABLET | Freq: Three times a day (TID) | ORAL | Status: DC | PRN
  Administered 2019-09-10 (×2): 25 mg via ORAL
  Filled 2019-09-09 (×2): qty 1

## 2019-09-09 MED ORDER — DIAZEPAM 2 MG PO TABS: 2.0000 mg | ORAL_TABLET | Freq: Four times a day (QID) | ORAL | Status: DC | PRN

## 2019-09-09 MED ORDER — ONDANSETRON HCL 4 MG PO TABS: 4.0000 mg | ORAL_TABLET | Freq: Four times a day (QID) | ORAL | Status: DC | PRN

## 2019-09-09 MED ORDER — ENOXAPARIN SODIUM 40 MG/0.4ML ~~LOC~~ SOLN
40.0000 mg | SUBCUTANEOUS | Status: DC
  Administered 2019-09-09 – 2019-09-11 (×3): 40 mg via SUBCUTANEOUS
  Filled 2019-09-09 (×3): qty 0.4

## 2019-09-09 MED ORDER — DIAZEPAM 5 MG/ML IJ SOLN
2.5000 mg | Freq: Once | INTRAMUSCULAR | Status: AC
  Administered 2019-09-09: 2.5 mg via INTRAVENOUS
  Filled 2019-09-09: qty 2

## 2019-09-09 MED ORDER — ACETAMINOPHEN 325 MG PO TABS: 650.0000 mg | ORAL_TABLET | Freq: Four times a day (QID) | ORAL | Status: DC | PRN

## 2019-09-09 NOTE — Evaluation (Signed)
Physical Therapy Evaluation Patient Details Name: Alice Shannon MRN: [id number] DOB: 10-25-1945 Today's Date: 09/09/2019   History of Present Illness  74 y.o. female with medical history significant of HTN.  Pt presents to the ED with c/o N/V and dizziness  Clinical Impression  Pt admitted with above diagnosis.  Pt currently with functional limitations due to the deficits listed below (see PT Problem List). Pt will benefit from skilled PT to increase their independence and safety with mobility to allow discharge to the venue listed below.  Pt presents with right beating nystagmus at rest which does not change direction with gaze.  Pt also with significant left lateral lean with standing and ambulating short distance.  Will return to check head thrust as pt may have L peripheral vestibular hypofunction and may also attempt to check for concomitant BPPV (left).     09/09/19 1411  Vestibular Assessment  General Observation Pt reports spontanous spinning sensation started yesterday while she was watching TV.  Pt denies falls or hitting head.  Symptom Behavior  Type of Dizziness  Imbalance;Spinning  Frequency of Dizziness increases with movement  Symptom Nature Constant  Aggravating Factors Supine to sit;Moving eyes;Activity in general;Sit to stand  Relieving Factors Head stationary;Closing eyes;Rest  Progression of Symptoms Better (N/V yesterday, none today)  Oculomotor Exam  Oculomotor Alignment Normal  Spontaneous Right beating nystagmus  Gaze-induced  Right beating nystagmus with L gaze;Right beating nystagmus with R gaze     Follow Up Recommendations Supervision/Assistance - 24 hour;Outpatient PT (OP neuro)    Equipment Recommendations  Rolling walker with 5" wheels    Recommendations for Other Services       Precautions / Restrictions Precautions Precautions: Fall      Mobility  Bed Mobility Overal bed mobility: Needs Assistance Bed Mobility: Supine to  Sit;Sit to Supine     Supine to sit: Min assist Sit to supine: Min guard   General bed mobility comments: assist for trunk due to dizziness  Transfers Overall transfer level: Needs assistance Equipment used: Rolling walker (2 wheeled) Transfers: Sit to/from Stand Sit to Stand: Mod assist         General transfer comment: verbal cues for hand placement, assist for left lateral lean and stability  Ambulation/Gait Ambulation/Gait assistance: Min assist Gait Distance (Feet): 8 Feet Assistive device: Rolling walker (2 wheeled) Gait Pattern/deviations: Step-through pattern;Decreased weight shift to right     General Gait Details: significant left lateral lean requiring assist for stability and RW, only ambulated to/from bathroom per pt request to use bathroom (no bucket in BSC, notified nursing)  Stairs            Wheelchair Mobility    Modified Rankin (Stroke Patients Only)       Balance Overall balance assessment: Needs assistance       Postural control: Left lateral lean Standing balance support: Bilateral upper extremity supported Standing balance-Leahy Scale: Poor Standing balance comment: requiring assist                             Pertinent Vitals/Pain Pain Assessment: No/denies pain    Home Living Family/patient expects to be discharged to:: Private residence Living Arrangements: Spouse/significant other   Type of Home: House         Home Equipment: None      Prior Function Level of Independence: Independent               Hand Dominance  Extremity/Trunk Assessment        Lower Extremity Assessment Lower Extremity Assessment: Overall WFL for tasks assessed    Cervical / Trunk Assessment Cervical / Trunk Assessment: Normal  Communication   Communication: No difficulties  Cognition Arousal/Alertness: Awake/alert Behavior During Therapy: WFL for tasks assessed/performed Overall Cognitive Status: Within  Functional Limits for tasks assessed                                        General Comments      Exercises     Assessment/Plan    PT Assessment Patient needs continued PT services  PT Problem List Decreased balance;Decreased knowledge of use of DME;Decreased mobility       PT Treatment Interventions Gait training;DME instruction;Therapeutic exercise;Balance training;Functional mobility training;Therapeutic activities;Patient/family education (compensation strategies)    PT Goals (Current goals can be found in the Care Plan section)  Acute Rehab PT Goals PT Goal Formulation: With patient Time For Goal Achievement: 09/16/19 Potential to Achieve Goals: Good    Frequency Min 3X/week   Barriers to discharge        Co-evaluation               AM-PAC PT "6 Clicks" Mobility  Outcome Measure Help needed turning from your back to your side while in a flat bed without using bedrails?: A Little Help needed moving from lying on your back to sitting on the side of a flat bed without using bedrails?: A Little Help needed moving to and from a bed to a chair (including a wheelchair)?: A Lot Help needed standing up from a chair using your arms (e.g., wheelchair or bedside chair)?: A Lot Help needed to walk in hospital room?: A Lot Help needed climbing 3-5 steps with a railing? : Total 6 Click Score: 13    End of Session Equipment Utilized During Treatment: Gait belt Activity Tolerance: Patient tolerated treatment well Patient left: in bed;with call bell/phone within reach;with bed alarm set;with family/visitor present   PT Visit Diagnosis: Other abnormalities of gait and mobility (R26.89)    Time: 6151-8343 PT Time Calculation (min) (ACUTE ONLY): 25 min   Charges:   PT Evaluation $PT Eval Moderate Complexity: 1 Mod        Kati PT, DPT Acute Rehabilitation Services Pager: [phone] Office: [phone]  York Ram E 09/09/2019, 3:53 PM

## 2019-09-09 NOTE — Progress Notes (Signed)
Physical Therapy Treatment Patient Details Name: Alice Shannon MRN: [id number] DOB: 1945/12/05 Today's Date: 09/09/2019    History of Present Illness 74 y.o. female with medical history significant of HTN.  Pt presents to the ED with c/o N/V and dizziness    PT Comments    Pt keeping neck stiff with attempt to perform head thrusts so repositioned for performing [employer].  Pt with constant right beating nystagmus (like at rest however less prominent appearing then this morning), no rotary component and no symptoms reported with either right or left side.  Pt then returned to sitting and attempted head thrusts again, and pt able to perform refixation to right and left however only with cues (also limited cervical neck motion as well ?guarding).  Pt reports sinus issues lately.  Pt also reports improvement in symptoms with looking to right.  Will attempt more mobility and compensation strategies next session.   Follow Up Recommendations  Supervision/Assistance - 24 hour;Outpatient PT (OP neuro)     Equipment Recommendations  Rolling walker with 5" wheels    Recommendations for Other Services       Precautions / Restrictions Precautions Precautions: Fall    Mobility  Bed Mobility Overal bed mobility: Needs Assistance Bed Mobility: Supine to Sit     Supine to sit: Min guard Sit to supine: Min guard   General bed mobility comments: increased time and effort  Transfers Overall transfer level: Needs assistance Equipment used: 1 person hand held assist Transfers: Sit to/from Stand Sit to Stand: Min assist         General transfer comment: verbal cues for hand placement, assist for left lateral lean and stability, stand and few steps up [facility] for repositioning  Ambulation/Gait Ambulation/Gait assistance: Min assist Gait Distance (Feet): 8 Feet Assistive device: Rolling walker (2 wheeled) Gait Pattern/deviations: Step-through pattern;Decreased weight shift to  right     General Gait Details: significant left lateral lean requiring assist for stability and RW, only ambulated to/from bathroom per pt request to use bathroom (no bucket in BSC, notified nursing)   Stairs             Wheelchair Mobility    Modified Rankin (Stroke Patients Only)       Balance Overall balance assessment: Needs assistance       Postural control: Left lateral lean Standing balance support: Bilateral upper extremity supported Standing balance-Leahy Scale: Poor Standing balance comment: requiring assist                            Cognition Arousal/Alertness: Awake/alert Behavior During Therapy: WFL for tasks assessed/performed Overall Cognitive Status: Within Functional Limits for tasks assessed                                        Exercises      General Comments        Pertinent Vitals/Pain Pain Assessment: No/denies pain    Home Living Family/patient expects to be discharged to:: Private residence Living Arrangements: Spouse/significant other   Type of Home: House       Home Equipment: None      Prior Function Level of Independence: Independent          PT Goals (current goals can now be found in the care plan section) Acute Rehab PT Goals PT Goal Formulation: With patient Time  For Goal Achievement: 09/16/19 Potential to Achieve Goals: Good Additional Goals Additional Goal #1: Pt will demonstrate compensation strategies during mobility without cues. Progress towards PT goals: Progressing toward goals    Frequency    Min 3X/week      PT Plan Current plan remains appropriate    Co-evaluation              AM-PAC PT "6 Clicks" Mobility   Outcome Measure  Help needed turning from your back to your side while in a flat bed without using bedrails?: A Little Help needed moving from lying on your back to sitting on the side of a flat bed without using bedrails?: A Little Help needed  moving to and from a bed to a chair (including a wheelchair)?: A Lot Help needed standing up from a chair using your arms (e.g., wheelchair or bedside chair)?: A Lot Help needed to walk in hospital room?: A Lot Help needed climbing 3-5 steps with a railing? : Total 6 Click Score: 13    End of Session Equipment Utilized During Treatment: Gait belt Activity Tolerance: Patient tolerated treatment well Patient left: in bed;with call bell/phone within reach;Other (comment) (called secretary after session to reapply bed alarm (forgot when leaving room))   PT Visit Diagnosis: Other abnormalities of gait and mobility (R26.89)     Time: 4037-0964 PT Time Calculation (min) (ACUTE ONLY): 19 min  Charges:  $Physical Performance Test: 8-22 mins                     Arlyce Dice, DPT Acute Rehabilitation Services Pager: [phone] Office: [phone]  York Ram E 09/09/2019, 5:08 PM

## 2019-09-09 NOTE — Progress Notes (Signed)
Patient seen and examined personally, I reviewed the chart, history and physical and admission note, done by admitting physician this morning and agree with the same with following addendum.  Please refer to the morning admission note for more detailed plan of care.  Briefly,  2-year female with history of hypertension presented to the ED with complaint nausea vomiting and dizziness that started suddenly at 6 PM 09/08/2019. She was seen in the ED had MRI that was negative for acute finding and also CT. ED try to ambulate her but could not do so patient was admitted.  Morning patient complains of ongoing dizziness especially getting up. No nausea vomiting. Denies chest pain numbness tingling or focal weakness.  On exam negative cerebellar sign but with nystagmus. PT eval done patient was not able to do well with PT.  He remained symptomatic.  Dizzines: suspecting BPPV no evidence of stroke in the MRI.  We will continue meclizine if remains symptomatic we will add Valium.  Continue PT OT.

## 2019-09-09 NOTE — H&P (Signed)
History and Physical    Alice Shannon NTI:[id number] DOB: Jul 15, 1945 DOA: 09/08/2019  PCP: Leeroy Cha, MD  Patient coming from: Home  I have personally briefly reviewed patient's old medical records in [place]  Chief Complaint: Vertigo  HPI: Alice Shannon is a 74 y.o. female with medical history significant of HTN.  Pt presents to the ED with c/o N/V and dizziness.  Onset suddenly at 6pm.  Difficulty focusing eyes.  Problems with balance.  Describes quality as "room spinning sensation" symptoms worse with change in position.   ED Course: MRI brain neg  Does have diminutive vertebrobasilar system, but no acute findings on CTA.  Symptoms persist and pt unable to ambulate despite valium and meclizine in ED.   Review of Systems: As per HPI, otherwise all review of systems negative.  Past Medical History:  Diagnosis Date   Allergy    Hypertension     Past Surgical History:  Procedure Laterality Date   BREAST BIOPSY Right 10/17/2011   stereo/benign   COLONOSCOPY     POLYPECTOMY     TUBAL LIGATION  1983     reports that she has never smoked. She has never used smokeless tobacco. She reports that she does not drink alcohol and does not use drugs.  No Known Allergies  Family History  Problem Relation Age of Onset   Colon cancer Neg Hx    Esophageal cancer Neg Hx    Rectal cancer Neg Hx    Stomach cancer Neg Hx    Colon polyps Neg Hx      Prior to Admission medications   Medication Sig Start Date End Date Taking? Authorizing Provider  cetirizine (ZYRTEC) 10 MG tablet Take 10 mg by mouth daily.   Yes [provider]  cloNIDine (CATAPRES) 0.1 MG tablet Take 0.1 mg by mouth 2 (two) times daily.    [provider]    Physical Exam: Vitals:   09/08/19 2100 09/08/19 2145 09/08/19 2345 09/09/19 0115  BP: (!) 159/86 (!) 167/82 (!) 151/78 (!) 143/82  Pulse: 94 68 87 79  Resp: 19 20 17 15   Temp:       SpO2: 100% 100% 100% 100%  Weight: 63.5 kg     Height: 4\' 11"  (1.499 m)       Constitutional: NAD, calm, comfortable Eyes: PERRL, lids and conjunctivae normal ENMT: Mucous membranes are moist. Posterior pharynx clear of any exudate or lesions.Normal dentition.  Neck: normal, supple, no masses, no thyromegaly Respiratory: clear to auscultation bilaterally, no wheezing, no crackles. Normal respiratory effort. No accessory muscle use.  Cardiovascular: Regular rate and rhythm, no murmurs / rubs / gallops. No extremity edema. 2+ pedal pulses. No carotid bruits.  Abdomen: no tenderness, no masses palpated. No hepatosplenomegaly. Bowel sounds positive.  Musculoskeletal: no clubbing / cyanosis. No joint deformity upper and lower extremities. Good ROM, no contractures. Normal muscle tone.  Skin: no rashes, lesions, ulcers. No induration Neurologic: CN 2-12 grossly intact. Sensation intact, DTR normal. Strength 5/5 in all 4. Nystagmus present. Psychiatric: Normal judgment and insight. Alert and oriented x 3. Normal mood.    Labs on Admission: I have personally reviewed following labs and imaging studies  CBC: Recent Labs  Lab 09/08/19 2117  WBC 9.6  NEUTROABS 8.0*  HGB 14.5  HCT 44.0  MCV 93.4  PLT 867   Basic Metabolic Panel: Recent Labs  Lab 09/08/19 2117  NA 142  K 3.9  CL 105  CO2 26  GLUCOSE  138*  BUN 12  CREATININE 0.90  CALCIUM 9.5   GFR: Estimated Creatinine Clearance: 43.1 mL/min (by C-G formula based on SCr of 0.9 mg/dL). Liver Function Tests: Recent Labs  Lab 09/08/19 2117  AST 28  ALT 23  ALKPHOS 89  BILITOT 0.6  PROT 8.5*  ALBUMIN 4.5   No results for input(s): LIPASE, AMYLASE in the last 168 hours. No results for input(s): AMMONIA in the last 168 hours. Coagulation Profile: Recent Labs  Lab 09/08/19 2117  INR 1.1   Cardiac Enzymes: No results for input(s): CKTOTAL, CKMB, CKMBINDEX, TROPONINI in the last 168 hours. BNP (last 3 results) No  results for input(s): PROBNP in the last 8760 hours. HbA1C: No results for input(s): HGBA1C in the last 72 hours. CBG: No results for input(s): GLUCAP in the last 168 hours. Lipid Profile: No results for input(s): CHOL, HDL, LDLCALC, TRIG, CHOLHDL, LDLDIRECT in the last 72 hours. Thyroid Function Tests: No results for input(s): TSH, T4TOTAL, FREET4, T3FREE, THYROIDAB in the last 72 hours. Anemia Panel: No results for input(s): VITAMINB12, FOLATE, FERRITIN, TIBC, IRON, RETICCTPCT in the last 72 hours. Urine analysis: No results found for: COLORURINE, APPEARANCEUR, [place], [place], GLUCOSEU, HGBUR, BILIRUBINUR, KETONESUR, PROTEINUR, UROBILINOGEN, NITRITE, LEUKOCYTESUR  Radiological Exams on Admission: CT Angio Head W/Cm &/Or Wo Cm  Result Date: 09/09/2019 CLINICAL DATA:  Initial evaluation for acute vertigo. EXAM: CT ANGIOGRAPHY HEAD AND NECK TECHNIQUE: Multidetector CT imaging of the head and neck was performed using the standard protocol during bolus administration of intravenous contrast. Multiplanar CT image reconstructions and MIPs were obtained to evaluate the vascular anatomy. Carotid stenosis measurements (when applicable) are obtained utilizing NASCET criteria, using the distal internal carotid diameter as the denominator. CONTRAST:  169mL OMNIPAQUE IOHEXOL 350 MG/ML SOLN COMPARISON:  Prior brain MRI from earlier same day. FINDINGS: CT HEAD FINDINGS Brain: Cerebral volume within normal limits for patient age. Mild chronic microvascular ischemic disease. No evidence for acute intracranial hemorrhage. No findings to suggest acute large vessel territory infarct. No mass lesion, midline shift, or mass effect. Ventricles are normal in size without evidence for hydrocephalus. No extra-axial fluid collection identified. Vascular: No hyperdense vessel identified. Skull: Scalp soft tissues demonstrate no acute abnormality. Calvarium intact. Sinuses/Orbits: Globes and orbital soft tissues within normal  limits. Visualized paranasal sinuses are clear. No mastoid effusion. CTA NECK FINDINGS Aortic arch: Visualized aortic arch of normal caliber with normal 3 vessel morphology. No hemodynamically significant stenosis seen about the origin of the great vessels. Right carotid system: Right common carotid artery widely patent from its origin to the bifurcation without stenosis. Mild atheromatous irregularity about the right bifurcation without flow-limiting stenosis. Right ICA widely patent distally without stenosis, dissection or occlusion. Left carotid system: Left common carotid artery widely patent to the bifurcation without stenosis. Mild atheromatous irregularity about the left bifurcation/proximal left ICA without flow-limiting stenosis. Left ICA widely patent distally without stenosis, dissection or occlusion. Vertebral arteries: Both vertebral arteries arise from the subclavian arteries. Strongly dominant left vertebral artery with diffusely hypoplastic right vertebral artery noted. No proximal subclavian artery stenosis. Vertebral arteries widely patent within the neck without stenosis, dissection or occlusion. Skeleton: No acute osseous abnormality. No discrete or worrisome osseous lesions. Prominent bulky anterior osteophytic spurring noted anteriorly at the C1-2 levels. Other neck: No other acute soft tissue abnormality within the neck. No mass lesion or adenopathy. Few small subcentimeter thyroid nodules measuring up to 5 mm noted, felt to be of doubtful clinical significance given size and patient age.  No follow-up imaging recommended. Upper chest: Visualized upper chest demonstrates no acute finding. Review of the MIP images confirms the above findings CTA HEAD FINDINGS Anterior circulation: Both internal carotid arteries widely patent to the termini without stenosis or other abnormality. A1 segments patent. Normal anterior communicating artery complex. Anterior cerebral arteries widely patent without  stenosis. No M1 stenosis or occlusion. Normal MCA bifurcations. Distal MCA branches well perfused and symmetric. Posterior circulation: Dominant left vertebral artery patent to the vertebrobasilar junction without stenosis. Patent left PICA. Hypoplastic right vertebral artery largely terminates in PICA. Basilar diffusely diminutive but patent to its distal aspect without stenosis. Superior cerebral arteries patent bilaterally. Fetal type origin of both PCAs supplied via a robust bilateral posterior communicating arteries. Both PCAs well perfused to their distal aspects. Venous sinuses: Patent. Anatomic variants: Fetal type origin of the PCAs with overall diminutive vertebrobasilar system. No intracranial aneurysm. Review of the MIP images confirms the above findings IMPRESSION: CT HEAD IMPRESSION: 1. No acute intracranial abnormality. 2. Mild chronic microvascular ischemic disease for age. CTA HEAD AND NECK IMPRESSION: 1. Negative CTA of the head and neck. No large vessel occlusion, hemodynamically significant stenosis, or other acute vascular abnormality. 2. Mild atheromatous irregularity about the carotid bifurcations without hemodynamically significant stenosis. 3. Fetal type origin of the PCAs with overall diminutive vertebrobasilar system. Electronically Signed   By: Jeannine Boga M.D.   On: 09/09/2019 00:21   CT Angio Neck W and/or Wo Contrast  Result Date: 09/09/2019 CLINICAL DATA:  Initial evaluation for acute vertigo. EXAM: CT ANGIOGRAPHY HEAD AND NECK TECHNIQUE: Multidetector CT imaging of the head and neck was performed using the standard protocol during bolus administration of intravenous contrast. Multiplanar CT image reconstructions and MIPs were obtained to evaluate the vascular anatomy. Carotid stenosis measurements (when applicable) are obtained utilizing NASCET criteria, using the distal internal carotid diameter as the denominator. CONTRAST:  122mL OMNIPAQUE IOHEXOL 350 MG/ML SOLN  COMPARISON:  Prior brain MRI from earlier same day. FINDINGS: CT HEAD FINDINGS Brain: Cerebral volume within normal limits for patient age. Mild chronic microvascular ischemic disease. No evidence for acute intracranial hemorrhage. No findings to suggest acute large vessel territory infarct. No mass lesion, midline shift, or mass effect. Ventricles are normal in size without evidence for hydrocephalus. No extra-axial fluid collection identified. Vascular: No hyperdense vessel identified. Skull: Scalp soft tissues demonstrate no acute abnormality. Calvarium intact. Sinuses/Orbits: Globes and orbital soft tissues within normal limits. Visualized paranasal sinuses are clear. No mastoid effusion. CTA NECK FINDINGS Aortic arch: Visualized aortic arch of normal caliber with normal 3 vessel morphology. No hemodynamically significant stenosis seen about the origin of the great vessels. Right carotid system: Right common carotid artery widely patent from its origin to the bifurcation without stenosis. Mild atheromatous irregularity about the right bifurcation without flow-limiting stenosis. Right ICA widely patent distally without stenosis, dissection or occlusion. Left carotid system: Left common carotid artery widely patent to the bifurcation without stenosis. Mild atheromatous irregularity about the left bifurcation/proximal left ICA without flow-limiting stenosis. Left ICA widely patent distally without stenosis, dissection or occlusion. Vertebral arteries: Both vertebral arteries arise from the subclavian arteries. Strongly dominant left vertebral artery with diffusely hypoplastic right vertebral artery noted. No proximal subclavian artery stenosis. Vertebral arteries widely patent within the neck without stenosis, dissection or occlusion. Skeleton: No acute osseous abnormality. No discrete or worrisome osseous lesions. Prominent bulky anterior osteophytic spurring noted anteriorly at the C1-2 levels. Other neck: No  other acute soft tissue abnormality within the  neck. No mass lesion or adenopathy. Few small subcentimeter thyroid nodules measuring up to 5 mm noted, felt to be of doubtful clinical significance given size and patient age. No follow-up imaging recommended. Upper chest: Visualized upper chest demonstrates no acute finding. Review of the MIP images confirms the above findings CTA HEAD FINDINGS Anterior circulation: Both internal carotid arteries widely patent to the termini without stenosis or other abnormality. A1 segments patent. Normal anterior communicating artery complex. Anterior cerebral arteries widely patent without stenosis. No M1 stenosis or occlusion. Normal MCA bifurcations. Distal MCA branches well perfused and symmetric. Posterior circulation: Dominant left vertebral artery patent to the vertebrobasilar junction without stenosis. Patent left PICA. Hypoplastic right vertebral artery largely terminates in PICA. Basilar diffusely diminutive but patent to its distal aspect without stenosis. Superior cerebral arteries patent bilaterally. Fetal type origin of both PCAs supplied via a robust bilateral posterior communicating arteries. Both PCAs well perfused to their distal aspects. Venous sinuses: Patent. Anatomic variants: Fetal type origin of the PCAs with overall diminutive vertebrobasilar system. No intracranial aneurysm. Review of the MIP images confirms the above findings IMPRESSION: CT HEAD IMPRESSION: 1. No acute intracranial abnormality. 2. Mild chronic microvascular ischemic disease for age. CTA HEAD AND NECK IMPRESSION: 1. Negative CTA of the head and neck. No large vessel occlusion, hemodynamically significant stenosis, or other acute vascular abnormality. 2. Mild atheromatous irregularity about the carotid bifurcations without hemodynamically significant stenosis. 3. Fetal type origin of the PCAs with overall diminutive vertebrobasilar system. Electronically Signed   By: Jeannine Boga  M.D.   On: 09/09/2019 00:21   MR BRAIN WO CONTRAST  Result Date: 09/08/2019 CLINICAL DATA:  Initial evaluation for acute nausea, vomiting, dizziness. EXAM: MRI HEAD WITHOUT CONTRAST TECHNIQUE: Multiplanar, multiecho pulse sequences of the brain and surrounding structures were obtained without intravenous contrast. COMPARISON:  None available. FINDINGS: Brain: Cerebral volume within normal limits for age. Scattered patchy T2/FLAIR signal abnormality seen within the periventricular and deep white matter both cerebral hemispheres as well as the pons, most consistent with chronic small vessel ischemic disease, mild in nature. No abnormal foci of restricted diffusion to suggest acute or subacute ischemia. Gray-white matter differentiation maintained. No encephalomalacia to suggest chronic cortical infarction. No foci of susceptibility artifact to suggest acute or chronic intracranial hemorrhage. No mass lesion, midline shift or mass effect. No hydrocephalus or extra-axial fluid collection. 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 Midline structures intact. Vascular: Major intracranial vascular flow voids are maintained. Skull and upper cervical spine: Craniocervical junction within normal limits. Bone marrow signal intensity normal. No scalp soft tissue abnormality. Sinuses/Orbits: Globes and orbital soft tissues within normal limits. Paranasal sinuses are largely clear. No mastoid effusion. Inner ear structures grossly normal. Other: None. IMPRESSION: 1. No acute intracranial abnormality. 2. Mild chronic microvascular ischemic disease for age. Electronically Signed   By: Jeannine Boga M.D.   On: 09/08/2019 23:24    EKG: Independently reviewed.  Assessment/Plan Principal Problem:   BPPV (benign paroxysmal positional vertigo)    1. BPPV - 1. Vertigo felt to be peripheral: 1. CTA and MRI neg for stroke findings 2. Meclizine PRN 3. PT eval and treat 4. Place in obs since pt cant ambulate  DVT  prophylaxis: Lovenox Code Status: Full Family Communication: Family at bedside Disposition Plan: Home after pt able to ambulate Consults called: None Admission status: Place in obs    Jakara Blatter, [place] [facility]  How to contact the [facility] Attending or Consulting provider [place]  or covering provider during after hours [place], for this patient?  1. Check the care team in [facility] and look for a) attending/consulting [facility] provider listed and b) the [facility] team listed 2. Log into [email or web address]  Amion Physician Scheduling and messaging for groups and whole hospitals  On call and physician scheduling software for group practices, residents, hospitalists and other medical providers for call, clinic, rotation and shift schedules. On[employer] is a hospital-wide system for scheduling doctors and paging doctors on call. EasyPlot is for scientific plotting and data analysis.  [email or web address]  and use [place]'s universal password to access. If you do not have the password, please contact the hospital operator.  3. Locate the [facility] provider you are looking for under [facility] and page to a number that you can be directly reached. 4. If you still have difficulty reaching the provider, please page the [facility] (Director on Call) for the Hospitalists listed on amion for assistance.  09/09/2019, 2:28 AM

## 2019-09-09 NOTE — Progress Notes (Signed)
Attempted to ambulate patient. Patient able to sit on edge of bed but upon standing, began to sway, stated the room was spinning. Unable to ambulate with assistance, assisted back into bed.

## 2019-09-10 DIAGNOSIS — H8110 Benign paroxysmal vertigo, unspecified ear: Secondary | ICD-10-CM | POA: Diagnosis not present

## 2019-09-10 NOTE — Progress Notes (Signed)
Physical Therapy Treatment Patient Details Name: Alice Shannon MRN: [id number] DOB: [birth year] Today's Date: 09/10/2019    History of Present Illness 74 y.o. female with medical history significant of HTN.  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 Patient reports " pulling" to the left. When upright and ambulating,, demonstrates left lean with inability to stand upright. Worsens the longer upright. Patient with noted nystagmus and ? Rotations of left eye. Patient currently requires assistance for any OOB mobility. PT recommends OPPT for vestibular rehab but earlieast appointment  Today is 7/29!!!! Patient may benefit from [place]  In the interim. Continue assessment and tretment of abnormal vestibular issues.   Follow Up Recommendations  Supervision/Assistance - 24 hour;Outpatient PT     Equipment Recommendations  Rolling walker with 5" wheels    Recommendations for Other Services       Precautions / Restrictions Precautions Precautions: Fall    Mobility  Bed Mobility   Bed Mobility: Rolling;Sidelying to Sit Rolling: Supervision Sidelying to sit: Min guard       General bed mobility comments: increased time and effort, rolling to the left,kkeping head turned to left  Transfers Overall transfer level: Needs assistance Equipment used: Rolling walker (2 wheeled) Transfers: Sit to/from Stand Sit to Stand: Min assist         General transfer comment: steady assist, decreased hand hold to RW, RW lifting up enven before taking a step  Ambulation/Gait Ambulation/Gait assistance: Mod assist Gait Distance (Feet): 10 Feet (then 20)   Gait Pattern/deviations: Step-through pattern;Decreased weight shift to right     General Gait Details: significant left lateral lean requiring assist for stability and RW, RW not staying in contact with floor,  support on left due to lean   Stairs             Wheelchair Mobility    Modified Rankin (Stroke Patients Only)       Balance Overall balance assessment: Needs assistance Sitting-balance support: Bilateral upper extremity supported;Feet supported     Postural control: Left lateral lean Standing balance support: Bilateral upper extremity supported Standing balance-Leahy Scale: Poor Standing balance comment: requiring assist, increased left lean the longer she stands.                            Cognition Arousal/Alertness: Awake/alert Behavior During Therapy: WFL for tasks assessed/performed                                          Exercises      General Comments        Pertinent Vitals/Pain      Home Living                      Prior Function            PT Goals (current goals can now be found in the care plan section)      Frequency    Min 3X/week      PT Plan Current plan remains appropriate    Co-evaluation              AM-PAC PT "6 Clicks" Mobility   Outcome Measure  Help needed turning from your back  to your side while in a flat bed without using bedrails?: A Little Help needed moving from lying on your back to sitting on the side of a flat bed without using bedrails?: A Little Help needed moving to and from a bed to a chair (including a wheelchair)?: A Lot Help needed standing up from a chair using your arms (e.g., wheelchair or bedside chair)?: A Lot Help needed to walk in hospital room?: A Lot Help needed climbing 3-5 steps with a railing? : Total 6 Click Score: 13    End of Session Equipment Utilized During Treatment: Gait belt Activity Tolerance: Patient tolerated treatment well Patient left: Other (comment);in chair;with chair alarm set Nurse Communication: Mobility status PT Visit Diagnosis: Other abnormalities of gait and mobility (R26.89)     Time: 1410-1444 PT Time  Calculation (min) (ACUTE ONLY): 34 min  Charges:  $Gait Training: 8-22 mins $Neuromuscular Re-education: 8-22 mins                     Tresa Endo PT Acute Rehabilitation Services Pager [phone] Office [phone]    Claretha Cooper 09/10/2019, 3:02 PM

## 2019-09-10 NOTE — Progress Notes (Signed)
PROGRESS NOTE    Alice Shannon  XNT:[id number] DOB: October 31, 1945 DOA: 09/08/2019 PCP: Leeroy Cha, MD   Chef Complaints: dizziness  Brief Narrative: 46-year female with history of hypertension presented to the ED with complaint nausea vomiting and dizziness that started suddenly at 6 PM 09/08/2019. She was seen in the ED had MRI that was negative for acute finding and also CT. ED try to ambulate her but could not do so patient was admitted.   Subjective:  Some better. Still left face/left sided dizziness and heaviness when she turns to left, okay on turning on rt   Assessment & Plan: Dizzines: suspecting BPPV no evidence of stroke in the MRI. Nystagmus+ on lateral gaze. Symptomatic on turning to left. Cont pt/ot, cont meclizine and valium. No vomiting and tolerating diet. Non focal on exam.  DVT prophylaxis: enoxaparin (LOVENOX) injection 40 mg Start: 09/09/19 1000 Code Status:full Family Communication: plan of care discussed with patient at bedside.  Status is: admitted as Observation Remains hospitalized with ongoing dizziness, difficulty with ambulation.cont o ntrial of meds, PT to attempt dix-hallpike  Dispo: The patient is from: Home              Anticipated d/c is to: rehab vs home with home health              Anticipated d/c date is: 1 day              Patient currently is not medically stable to d/c.  Nutrition: Diet Order            Diet Heart Room service appropriate? Yes; Fluid consistency: Thin  Diet effective now                       Body mass index is 28.28 kg/m.  Consultants:see note  Procedures:see note Microbiology:see note  Medications: Scheduled Meds: . enoxaparin (LOVENOX) injection  40 mg Subcutaneous Q24H   Continuous Infusions:  Antimicrobials: Anti-infectives (From admission, onward)   None       Objective: Vitals: Today's Vitals   09/09/19 2030 09/09/19 2120 09/10/19 0405 09/10/19 1300  BP: 119/62   109/63   Pulse: 87  76   Resp: 19  19   Temp: 97.9 F (36.6 C)  98.8 F (37.1 C)   TempSrc:      SpO2: 97%  99%   Weight:      Height:      PainSc:  0-No pain  0-No pain    Intake/Output Summary (Last 24 hours) at 09/10/2019 1455 Last data filed at 09/10/2019 1035 Gross per 24 hour  Intake 415 ml  Output --  Net 415 ml   Filed Weights   09/08/19 2100  Weight: 63.5 kg   Weight change:    Intake/Output from previous day: 07/06 0701 - 07/07 0700 In: 280 [P.O.:280] Out: -  Intake/Output this shift: Total I/O In: 255 [P.O.:255] Out: -   Examination:  General exam: AAO x3,NAD, weak appearing. HEENT:Oral mucosa moist, Ear/Nose WNL grossly,dentition normal. Respiratory system: bilaterally clear,no wheezing or crackles,no use of accessory muscle, non tender. Cardiovascular system: S1 & S2 +, regular, No JVD. Gastrointestinal system: Abdomen soft, NT,ND, BS+. Nervous System:Alert, awake, moving extremities and grossly non-focal. Nystagmus+ on lateal gaze. Extremities: No edema, distal peripheral pulses palpable.  Skin: No rashes,no icterus. MSK: Normal muscle bulk,tone, power  Data Reviewed: I have personally reviewed following labs and imaging studies CBC: Recent Labs  Lab 09/08/19 2117  WBC  9.6  NEUTROABS 8.0*  HGB 14.5  HCT 44.0  MCV 93.4  PLT 628   Basic Metabolic Panel: Recent Labs  Lab 09/08/19 2117  NA 142  K 3.9  CL 105  CO2 26  GLUCOSE 138*  BUN 12  CREATININE 0.90  CALCIUM 9.5   GFR: Estimated Creatinine Clearance: 43.1 mL/min (by C-G formula based on SCr of 0.9 mg/dL). Liver Function Tests: Recent Labs  Lab 09/08/19 2117  AST 28  ALT 23  ALKPHOS 89  BILITOT 0.6  PROT 8.5*  ALBUMIN 4.5   No results for input(s): LIPASE, AMYLASE in the last 168 hours. No results for input(s): AMMONIA in the last 168 hours. Coagulation Profile: Recent Labs  Lab 09/08/19 2117  INR 1.1   Cardiac Enzymes: No results for input(s): CKTOTAL, CKMB,  CKMBINDEX, TROPONINI in the last 168 hours. BNP (last 3 results) No results for input(s): PROBNP in the last 8760 hours. HbA1C: No results for input(s): HGBA1C in the last 72 hours. CBG: No results for input(s): GLUCAP in the last 168 hours. Lipid Profile: No results for input(s): CHOL, HDL, LDLCALC, TRIG, CHOLHDL, LDLDIRECT in the last 72 hours. Thyroid Function Tests: No results for input(s): TSH, T4TOTAL, FREET4, T3FREE, THYROIDAB in the last 72 hours. Anemia Panel: No results for input(s): VITAMINB12, FOLATE, FERRITIN, TIBC, IRON, RETICCTPCT in the last 72 hours. Sepsis Labs: No results for input(s): PROCALCITON, LATICACIDVEN in the last 168 hours.  No results found for this or any previous visit (from the past 240 hour(s)).    Radiology Studies: CT Angio Head W/Cm &/Or Wo Cm  Result Date: 09/09/2019 CLINICAL DATA:  Initial evaluation for acute vertigo. EXAM: CT ANGIOGRAPHY HEAD AND NECK TECHNIQUE: Multidetector CT imaging of the head and neck was performed using the standard protocol during bolus administration of intravenous contrast. Multiplanar CT image reconstructions and MIPs were obtained to evaluate the vascular anatomy. Carotid stenosis measurements (when applicable) are obtained utilizing NASCET criteria, using the distal internal carotid diameter as the denominator. CONTRAST:  141mL OMNIPAQUE IOHEXOL 350 MG/ML SOLN COMPARISON:  Prior brain MRI from earlier same day. FINDINGS: CT HEAD FINDINGS Brain: Cerebral volume within normal limits for patient age. Mild chronic microvascular ischemic disease. No evidence for acute intracranial hemorrhage. No findings to suggest acute large vessel territory infarct. No mass lesion, midline shift, or mass effect. Ventricles are normal in size without evidence for hydrocephalus. No extra-axial fluid collection identified. Vascular: No hyperdense vessel identified. Skull: Scalp soft tissues demonstrate no acute abnormality. Calvarium intact.  Sinuses/Orbits: Globes and orbital soft tissues within normal limits. Visualized paranasal sinuses are clear. No mastoid effusion. CTA NECK FINDINGS Aortic arch: Visualized aortic arch of normal caliber with normal 3 vessel morphology. No hemodynamically significant stenosis seen about the origin of the great vessels. Right carotid system: Right common carotid artery widely patent from its origin to the bifurcation without stenosis. Mild atheromatous irregularity about the right bifurcation without flow-limiting stenosis. Right ICA widely patent distally without stenosis, dissection or occlusion. Left carotid system: Left common carotid artery widely patent to the bifurcation without stenosis. Mild atheromatous irregularity about the left bifurcation/proximal left ICA without flow-limiting stenosis. Left ICA widely patent distally without stenosis, dissection or occlusion. Vertebral arteries: Both vertebral arteries arise from the subclavian arteries. Strongly dominant left vertebral artery with diffusely hypoplastic right vertebral artery noted. No proximal subclavian artery stenosis. Vertebral arteries widely patent within the neck without stenosis, dissection or occlusion. Skeleton: No acute osseous abnormality. No discrete or worrisome osseous  lesions. Prominent bulky anterior osteophytic spurring noted anteriorly at the C1-2 levels. Other neck: No other acute soft tissue abnormality within the neck. No mass lesion or adenopathy. Few small subcentimeter thyroid nodules measuring up to 5 mm noted, felt to be of doubtful clinical significance given size and patient age. No follow-up imaging recommended. Upper chest: Visualized upper chest demonstrates no acute finding. Review of the MIP images confirms the above findings CTA HEAD FINDINGS Anterior circulation: Both internal carotid arteries widely patent to the termini without stenosis or other abnormality. A1 segments patent. Normal anterior communicating artery  complex. Anterior cerebral arteries widely patent without stenosis. No M1 stenosis or occlusion. Normal MCA bifurcations. Distal MCA branches well perfused and symmetric. Posterior circulation: Dominant left vertebral artery patent to the vertebrobasilar junction without stenosis. Patent left PICA. Hypoplastic right vertebral artery largely terminates in PICA. Basilar diffusely diminutive but patent to its distal aspect without stenosis. Superior cerebral arteries patent bilaterally. Fetal type origin of both PCAs supplied via a robust bilateral posterior communicating arteries. Both PCAs well perfused to their distal aspects. Venous sinuses: Patent. Anatomic variants: Fetal type origin of the PCAs with overall diminutive vertebrobasilar system. No intracranial aneurysm. Review of the MIP images confirms the above findings IMPRESSION: CT HEAD IMPRESSION: 1. No acute intracranial abnormality. 2. Mild chronic microvascular ischemic disease for age. CTA HEAD AND NECK IMPRESSION: 1. Negative CTA of the head and neck. No large vessel occlusion, hemodynamically significant stenosis, or other acute vascular abnormality. 2. Mild atheromatous irregularity about the carotid bifurcations without hemodynamically significant stenosis. 3. Fetal type origin of the PCAs with overall diminutive vertebrobasilar system. Electronically Signed   By: Jeannine Boga M.D.   On: 09/09/2019 00:21   CT Angio Neck W and/or Wo Contrast  Result Date: 09/09/2019 CLINICAL DATA:  Initial evaluation for acute vertigo. EXAM: CT ANGIOGRAPHY HEAD AND NECK TECHNIQUE: Multidetector CT imaging of the head and neck was performed using the standard protocol during bolus administration of intravenous contrast. Multiplanar CT image reconstructions and MIPs were obtained to evaluate the vascular anatomy. Carotid stenosis measurements (when applicable) are obtained utilizing NASCET criteria, using the distal internal carotid diameter as the  denominator. CONTRAST:  17mL OMNIPAQUE IOHEXOL 350 MG/ML SOLN COMPARISON:  Prior brain MRI from earlier same day. FINDINGS: CT HEAD FINDINGS Brain: Cerebral volume within normal limits for patient age. Mild chronic microvascular ischemic disease. No evidence for acute intracranial hemorrhage. No findings to suggest acute large vessel territory infarct. No mass lesion, midline shift, or mass effect. Ventricles are normal in size without evidence for hydrocephalus. No extra-axial fluid collection identified. Vascular: No hyperdense vessel identified. Skull: Scalp soft tissues demonstrate no acute abnormality. Calvarium intact. Sinuses/Orbits: Globes and orbital soft tissues within normal limits. Visualized paranasal sinuses are clear. No mastoid effusion. CTA NECK FINDINGS Aortic arch: Visualized aortic arch of normal caliber with normal 3 vessel morphology. No hemodynamically significant stenosis seen about the origin of the great vessels. Right carotid system: Right common carotid artery widely patent from its origin to the bifurcation without stenosis. Mild atheromatous irregularity about the right bifurcation without flow-limiting stenosis. Right ICA widely patent distally without stenosis, dissection or occlusion. Left carotid system: Left common carotid artery widely patent to the bifurcation without stenosis. Mild atheromatous irregularity about the left bifurcation/proximal left ICA without flow-limiting stenosis. Left ICA widely patent distally without stenosis, dissection or occlusion. Vertebral arteries: Both vertebral arteries arise from the subclavian arteries. Strongly dominant left vertebral artery with diffusely hypoplastic right vertebral artery  noted. No proximal subclavian artery stenosis. Vertebral arteries widely patent within the neck without stenosis, dissection or occlusion. Skeleton: No acute osseous abnormality. No discrete or worrisome osseous lesions. Prominent bulky anterior osteophytic  spurring noted anteriorly at the C1-2 levels. Other neck: No other acute soft tissue abnormality within the neck. No mass lesion or adenopathy. Few small subcentimeter thyroid nodules measuring up to 5 mm noted, felt to be of doubtful clinical significance given size and patient age. No follow-up imaging recommended. Upper chest: Visualized upper chest demonstrates no acute finding. Review of the MIP images confirms the above findings CTA HEAD FINDINGS Anterior circulation: Both internal carotid arteries widely patent to the termini without stenosis or other abnormality. A1 segments patent. Normal anterior communicating artery complex. Anterior cerebral arteries widely patent without stenosis. No M1 stenosis or occlusion. Normal MCA bifurcations. Distal MCA branches well perfused and symmetric. Posterior circulation: Dominant left vertebral artery patent to the vertebrobasilar junction without stenosis. Patent left PICA. Hypoplastic right vertebral artery largely terminates in PICA. Basilar diffusely diminutive but patent to its distal aspect without stenosis. Superior cerebral arteries patent bilaterally. Fetal type origin of both PCAs supplied via a robust bilateral posterior communicating arteries. Both PCAs well perfused to their distal aspects. Venous sinuses: Patent. Anatomic variants: Fetal type origin of the PCAs with overall diminutive vertebrobasilar system. No intracranial aneurysm. Review of the MIP images confirms the above findings IMPRESSION: CT HEAD IMPRESSION: 1. No acute intracranial abnormality. 2. Mild chronic microvascular ischemic disease for age. CTA HEAD AND NECK IMPRESSION: 1. Negative CTA of the head and neck. No large vessel occlusion, hemodynamically significant stenosis, or other acute vascular abnormality. 2. Mild atheromatous irregularity about the carotid bifurcations without hemodynamically significant stenosis. 3. Fetal type origin of the PCAs with overall diminutive vertebrobasilar  system. Electronically Signed   By: Jeannine Boga M.D.   On: 09/09/2019 00:21   MR BRAIN WO CONTRAST  Result Date: 09/08/2019 CLINICAL DATA:  Initial evaluation for acute nausea, vomiting, dizziness. EXAM: MRI HEAD WITHOUT CONTRAST TECHNIQUE: Multiplanar, multiecho pulse sequences of the brain and surrounding structures were obtained without intravenous contrast. COMPARISON:  None available. FINDINGS: Brain: Cerebral volume within normal limits for age. Scattered patchy T2/FLAIR signal abnormality seen within the periventricular and deep white matter both cerebral hemispheres as well as the pons, most consistent with chronic small vessel ischemic disease, mild in nature. No abnormal foci of restricted diffusion to suggest acute or subacute ischemia. Gray-white matter differentiation maintained. No encephalomalacia to suggest chronic cortical infarction. No foci of susceptibility artifact to suggest acute or chronic intracranial hemorrhage. No mass lesion, midline shift or mass effect. No hydrocephalus or extra-axial fluid collection. 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 Midline structures intact. Vascular: Major intracranial vascular flow voids are maintained. Skull and upper cervical spine: Craniocervical junction within normal limits. Bone marrow signal intensity normal. No scalp soft tissue abnormality. Sinuses/Orbits: Globes and orbital soft tissues within normal limits. Paranasal sinuses are largely clear. No mastoid effusion. Inner ear structures grossly normal. Other: None. IMPRESSION: 1. No acute intracranial abnormality. 2. Mild chronic microvascular ischemic disease for age. Electronically Signed   By: Jeannine Boga M.D.   On: 09/08/2019 23:24     LOS: 0 days   Antonieta Pert, MD [facility]  09/10/2019, 2:55 PM

## 2019-09-11 DIAGNOSIS — H8112 Benign paroxysmal vertigo, left ear: Secondary | ICD-10-CM

## 2019-09-11 MED ORDER — PREDNISONE 20 MG PO TABS
60.0000 mg | ORAL_TABLET | Freq: Every day | ORAL | Status: DC
  Administered 2019-09-11: 60 mg via ORAL
  Filled 2019-09-11: qty 3

## 2019-09-11 MED ORDER — PREDNISONE 10 MG PO TABS: ORAL_TABLET | ORAL | 0 refills | Status: AC

## 2019-09-11 MED ORDER — MECLIZINE HCL 25 MG PO TABS: 25.0000 mg | ORAL_TABLET | Freq: Three times a day (TID) | ORAL | 0 refills | Status: AC | PRN

## 2019-09-11 MED FILL — MECLIZINE HCL 25 MG TABS: 25 | 10 days supply | Qty: 30 | Fill #0

## 2019-09-11 MED FILL — predniSONE 10 MG TABS: 10 | 9 days supply | Qty: 35 | Fill #0

## 2019-09-11 NOTE — Progress Notes (Addendum)
Physical Therapy Treatment/Vestibular Re-assessment.  Patient Details Name: Alice Shannon MRN: [id number] DOB: 1945/11/21 Today's Date: 09/11/2019    History of Present Illness 74 y.o. female with medical history significant of HTN.  Pt presents to the ED with c/o N/V and dizziness    PT Comments    Pt presents with testing and symptoms consistent with left ear hypofunction likely due to an acute labarynthitis.  She has R beating spontaneous nystagmus consistent with Alexander's Law, mild loss of hearing in her left hear, and (+) head impulse test to the left.  She has some difficulty with gaze stability training mostly in the horizontal direction, so HEP x1 seated exercises given and reviewed (handout issued, copy in paper chart).  Pt would continue to benefit from vestibular treatment and continued assessment.  PT will continue to follow acutely for safe mobility progression   Follow Up Recommendations  Home health PT;Other (comment);Supervision for mobility/OOB (request HHPT for vestibular therapy if able)     Equipment Recommendations  Rolling walker with 5" wheels    Recommendations for Other Services   NA     Precautions / Restrictions Precautions Precautions: Fall Precaution Comments: left lateral lean    Mobility  Bed Mobility Overal bed mobility: Modified Independent Bed Mobility: Supine to Sit     Supine to sit: Supervision     General bed mobility comments: Pt used rail, extra time because she feels she is tipping to the left.   Transfers Overall transfer level: Needs assistance Equipment used: Rolling walker (2 wheeled) Transfers: Sit to/from Stand Sit to Stand: Min guard         General transfer comment: Min guard assist for safety, cues for safe hand placement during transitions.   Ambulation/Gait Ambulation/Gait assistance: Min assist Gait Distance (Feet): 75 Feet Assistive device: Rolling walker (2 wheeled) Gait Pattern/deviations:  Step-through pattern;Drifts right/left;Staggering left Gait velocity: decreased Gait velocity interpretation: 1.31 - 2.62 ft/sec, indicative of limited community ambulator General Gait Details: Pt with left lateral lean, so PT positioned on her left side, and encouraged her to have her pt stand on the left, turn to the right when able to avoid turning towards the side she is tipping.  Cues for safe RW use.            Balance Overall balance assessment: Needs assistance Sitting-balance support: Feet supported;No upper extremity supported;Bilateral upper extremity supported Sitting balance-Leahy Scale: Fair Sitting balance - Comments: in sitting left lateral tilt and left lateral head tilt Postural control: Left lateral lean Standing balance support: Bilateral upper extremity supported Standing balance-Leahy Scale: Poor Standing balance comment: needs external support from RW and therapist at times.          09/11/19 1310  Vestibular Assessment  General Observation head tipped to the left  Symptom Behavior  Subjective history of current problem pt reports spontaneous, sudden onset, does have a history of recent URI/sinus drainage and congestion, no recent head trauma or whiplash, no changes in vision, no tinnitus or fullness, no h/o previous episodes  Type of Dizziness  Imbalance;"Funny feeling in head";Comment (head feels heavy to the left)  Frequency of Dizziness increases with movement  Duration of Dizziness long  Symptom Nature Constant  Aggravating Factors Supine to sit;Moving eyes;Activity in general;Sit to stand  Relieving Factors Head stationary;Closing eyes;Rest  Progression of Symptoms Better  History of similar episodes no  Oculomotor Exam  Oculomotor Alignment Normal  Spontaneous Right beating nystagmus  Gaze-induced  Right beating nystagmus with R  gaze;Comment (R beating/upward R rotating, R gaze most, midline less)  Smooth Pursuits Saccades (considered normal for  her age)  Comment R beating 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   Right Head Impulse negative  Vestibulo-Ocular Reflex  VOR 1 Head Only (x 1 viewing) significantly more difficult with horizontal testing, vertical is much better/faster.   Auditory  Comments grossly deminished hearing on the left with finger rub testing.   09/11/2019 BPPV testing preformed on eval and was negative.  See Carmelia Bake, PT's evaluation note. Not indicated to repeat given pt's current presentation.                        Cognition Arousal/Alertness: Awake/alert Behavior During Therapy: WFL for tasks assessed/performed Overall Cognitive Status: Within Functional Limits for tasks assessed                                               Pertinent Vitals/Pain Pain Assessment: No/denies pain           PT Goals (current goals can now be found in the care plan section) Acute Rehab PT Goals Patient Stated Goal: to go home and decrease her dizziness Progress towards PT goals: Progressing toward goals    Frequency    Min 4X/week (due to acute vestibular issues)      PT Plan Discharge plan needs to be updated       AM-PAC PT "6 Clicks" Mobility   Outcome Measure  Help needed turning from your back to your side while in a flat bed without using bedrails?: A Little Help needed moving from lying on your back to sitting on the side of a flat bed without using bedrails?: A Little Help needed moving to and from a bed to a chair (including a wheelchair)?: A Little Help needed standing up from a chair using your arms (e.g., wheelchair or bedside chair)?: A Little Help needed to walk in hospital room?: A Little Help needed climbing 3-5 steps with a railing? : A Little 6 Click Score: 18    End of Session Equipment Utilized During Treatment: Gait belt Activity  Tolerance: Patient tolerated treatment well Patient left: in chair;with call bell/phone within reach Nurse Communication: Mobility status PT Visit Diagnosis: Other abnormalities of gait and mobility (R26.89);Dizziness and giddiness (R42)     Time: 8003-4917 PT Time Calculation (min) (ACUTE ONLY): 34 min  Charges:  $Gait Training: 8-22 mins                    Verdene Lennert, PT, DPT  Acute Rehabilitation [phone] pager [phone]) [phone] office

## 2019-09-11 NOTE — Discharge Summary (Signed)
Physician Discharge Summary  Alice Shannon DGL:[id number] DOB: [birth year] DOA: 09/08/2019  PCP: Leeroy Cha, MD  Admit date: 09/08/2019 Discharge date: 09/11/2019  Admitted From: hone Disposition: Home  Recommendations for Outpatient Follow-up:  1. Follow up with PCP in 1-2 weeks 2. Please obtain BMP/CBC in one week 3. Please follow up on the following pending results:  Home Health: Yes Equipment/Devices: Walker  Discharge Condition: Stable Code Status: Full code Diet recommendation:  Diet Order            Diet - low sodium heart healthy           Diet Heart Room service appropriate? Yes; Fluid consistency: Thin  Diet effective now                  Brief/Interim Summary:  54-year female with history of hypertension presented to the ED with complaint nausea vomiting and dizziness that started suddenly at 6 PM 09/08/2019. She was seen in the ED had MRI that was negative for acute finding and also CT.ED try to ambulate her but could not dosopatient was admitted.  Patient was managed medically today with Valium meclizine.  Overall symptoms are much better but he still having symptoms of the left-sided head movement will do a trial of prednisone 10 days course and patient is instructed to follow-up with PCP or seek immediate medical attention if she has any worsening symptoms or any neurological issues difficulty i.e. swallowing persistent symptoms  Discharge Diagnoses:  Vertigo no hearing difficulties no stroke on MRI brain.  No focal weakness.  Overall symptoms much better able to ambulate with physical therapy will send home on walker, will do a trial of prednisone on discharge and advised her to follow-up with PCP. No more nausea vomiting and tolerating diet.  Consults:  PT Subjective:  FEELING Much better, ambulated with physical therapy this morning with walker and rest seen on the bedside for chair.  Discharge Exam: Vitals:   09/11/19 0502  09/11/19 0829  BP: 104/63   Pulse: 69   Resp: 18   Temp: 98.2 F (36.8 C)   SpO2: 97% 94%   General: Pt is alert, awake, not in acute distress Cardiovascular: RRR, S1/S2 +, no rubs, no gallops Respiratory: CTA bilaterally, no wheezing, no rhonchi Abdominal: Soft, NT, ND, bowel sounds + Extremities: no edema, no cyanosis  Discharge Instructions  Discharge Instructions    Diet - low sodium heart healthy   Complete by: As directed    Discharge instructions   Complete by: As directed    Please call call MD or return to ER for similar or worsening recurring problem that brought you to hospital or if any fever,nausea/vomiting,abdominal pain, uncontrolled pain, chest pain,  shortness of breath or any other alarming symptoms.  Please follow-up your doctor as instructed w/I a week time and call the office for appointment.  If you notice any balance issues worsening of the condition dizziness vertigo please seek immediate medical attention  Please avoid alcohol, smoking, or any other illicit substance and maintain healthy habits including taking your regular medications as prescribed.  You were cared for by a hospitalist during your hospital stay. If you have any questions about your discharge medications or the care you received while you were in the hospital after you are discharged, you can call the unit and ask to speak with the hospitalist on call if the hospitalist that took care of you is not available.  Once you are discharged, your primary care  physician will handle any further medical issues. Please note that NO REFILLS for any discharge medications will be authorized once you are discharged, as it is imperative that you return to your primary care physician (or establish a relationship with a primary care physician if you do not have one) for your aftercare needs so that they can reassess your need for medications and monitor your lab values   For home use only DME 4 wheeled rolling  walker with seat   Complete by: As directed    Patient needs a walker to treat with the following condition: Vertigo   Increase activity slowly   Complete by: As directed      Allergies as of 09/11/2019   No Known Allergies     Medication List    TAKE these medications   cetirizine 10 MG tablet Commonly known as: ZYRTEC Take 10 mg by mouth daily.   cloNIDine 0.1 MG tablet Commonly known as: CATAPRES Take 0.1 mg by mouth 2 (two) times daily.   meclizine 25 MG tablet Commonly known as: ANTIVERT Take 1 tablet (25 mg total) by mouth 3 (three) times daily as needed for dizziness.   predniSONE 10 MG tablet Commonly known as: DELTASONE 60 mg daily on days 1 through 4, 40 mg on day 5, 30 mg on day 6, 20 mg on day 7, 10 mg on day 8, and 5 mg on day 9.            Durable Medical Equipment  (From admission, onward)         Start     Ordered   09/11/19 0000  For home use only DME 4 wheeled rolling walker with seat       Question:  Patient needs a walker to treat with the following condition  Answer:  Vertigo   09/11/19 1131          Follow-up Information    Leeroy Cha, MD Follow up in 5 day(s).   Specialty: Internal Medicine Contact information: [address] [place] [address] [phone]              No Known Allergies  The results of significant diagnostics from this hospitalization (including imaging, microbiology, ancillary and laboratory) are listed below for reference.    Microbiology: No results found for this or any previous visit (from the past 240 hour(s)).  Procedures/Studies: CT Angio Head W/Cm &/Or Wo Cm  Result Date: 09/09/2019 CLINICAL DATA:  Initial evaluation for acute vertigo. EXAM: CT ANGIOGRAPHY HEAD AND NECK TECHNIQUE: Multidetector CT imaging of the head and neck was performed using the standard protocol during bolus administration of intravenous contrast. Multiplanar CT image reconstructions and MIPs were  obtained to evaluate the vascular anatomy. Carotid stenosis measurements (when applicable) are obtained utilizing NASCET criteria, using the distal internal carotid diameter as the denominator. CONTRAST:  16mL OMNIPAQUE IOHEXOL 350 MG/ML SOLN COMPARISON:  Prior brain MRI from earlier same day. FINDINGS: CT HEAD FINDINGS Brain: Cerebral volume within normal limits for patient age. Mild chronic microvascular ischemic disease. No evidence for acute intracranial hemorrhage. No findings to suggest acute large vessel territory infarct. No mass lesion, midline shift, or mass effect. Ventricles are normal in size without evidence for hydrocephalus. No extra-axial fluid collection identified. Vascular: No hyperdense vessel identified. Skull: Scalp soft tissues demonstrate no acute abnormality. Calvarium intact. Sinuses/Orbits: Globes and orbital soft tissues within normal limits. Visualized paranasal sinuses are clear. No mastoid effusion. CTA NECK FINDINGS Aortic  arch: Visualized aortic arch of normal caliber with normal 3 vessel morphology. No hemodynamically significant stenosis seen about the origin of the great vessels. Right carotid system: Right common carotid artery widely patent from its origin to the bifurcation without stenosis. Mild atheromatous irregularity about the right bifurcation without flow-limiting stenosis. Right ICA widely patent distally without stenosis, dissection or occlusion. Left carotid system: Left common carotid artery widely patent to the bifurcation without stenosis. Mild atheromatous irregularity about the left bifurcation/proximal left ICA without flow-limiting stenosis. Left ICA widely patent distally without stenosis, dissection or occlusion. Vertebral arteries: Both vertebral arteries arise from the subclavian arteries. Strongly dominant left vertebral artery with diffusely hypoplastic right vertebral artery noted. No proximal subclavian artery stenosis. Vertebral arteries widely  patent within the neck without stenosis, dissection or occlusion. Skeleton: No acute osseous abnormality. No discrete or worrisome osseous lesions. Prominent bulky anterior osteophytic spurring noted anteriorly at the C1-2 levels. Other neck: No other acute soft tissue abnormality within the neck. No mass lesion or adenopathy. Few small subcentimeter thyroid nodules measuring up to 5 mm noted, felt to be of doubtful clinical significance given size and patient age. No follow-up imaging recommended. Upper chest: Visualized upper chest demonstrates no acute finding. Review of the MIP images confirms the above findings CTA HEAD FINDINGS Anterior circulation: Both internal carotid arteries widely patent to the termini without stenosis or other abnormality. A1 segments patent. Normal anterior communicating artery complex. Anterior cerebral arteries widely patent without stenosis. No M1 stenosis or occlusion. Normal MCA bifurcations. Distal MCA branches well perfused and symmetric. Posterior circulation: Dominant left vertebral artery patent to the vertebrobasilar junction without stenosis. Patent left PICA. Hypoplastic right vertebral artery largely terminates in PICA. Basilar diffusely diminutive but patent to its distal aspect without stenosis. Superior cerebral arteries patent bilaterally. Fetal type origin of both PCAs supplied via a robust bilateral posterior communicating arteries. Both PCAs well perfused to their distal aspects. Venous sinuses: Patent. Anatomic variants: Fetal type origin of the PCAs with overall diminutive vertebrobasilar system. No intracranial aneurysm. Review of the MIP images confirms the above findings IMPRESSION: CT HEAD IMPRESSION: 1. No acute intracranial abnormality. 2. Mild chronic microvascular ischemic disease for age. CTA HEAD AND NECK IMPRESSION: 1. Negative CTA of the head and neck. No large vessel occlusion, hemodynamically significant stenosis, or other acute vascular  abnormality. 2. Mild atheromatous irregularity about the carotid bifurcations without hemodynamically significant stenosis. 3. Fetal type origin of the PCAs with overall diminutive vertebrobasilar system. Electronically Signed   By: Jeannine Boga M.D.   On: 09/09/2019 00:21   CT Angio Neck W and/or Wo Contrast  Result Date: 09/09/2019 CLINICAL DATA:  Initial evaluation for acute vertigo. EXAM: CT ANGIOGRAPHY HEAD AND NECK TECHNIQUE: Multidetector CT imaging of the head and neck was performed using the standard protocol during bolus administration of intravenous contrast. Multiplanar CT image reconstructions and MIPs were obtained to evaluate the vascular anatomy. Carotid stenosis measurements (when applicable) are obtained utilizing NASCET criteria, using the distal internal carotid diameter as the denominator. CONTRAST:  140mL OMNIPAQUE IOHEXOL 350 MG/ML SOLN COMPARISON:  Prior brain MRI from earlier same day. FINDINGS: CT HEAD FINDINGS Brain: Cerebral volume within normal limits for patient age. Mild chronic microvascular ischemic disease. No evidence for acute intracranial hemorrhage. No findings to suggest acute large vessel territory infarct. No mass lesion, midline shift, or mass effect. Ventricles are normal in size without evidence for hydrocephalus. No extra-axial fluid collection identified. Vascular: No hyperdense vessel identified. Skull: Scalp soft  tissues demonstrate no acute abnormality. Calvarium intact. Sinuses/Orbits: Globes and orbital soft tissues within normal limits. Visualized paranasal sinuses are clear. No mastoid effusion. CTA NECK FINDINGS Aortic arch: Visualized aortic arch of normal caliber with normal 3 vessel morphology. No hemodynamically significant stenosis seen about the origin of the great vessels. Right carotid system: Right common carotid artery widely patent from its origin to the bifurcation without stenosis. Mild atheromatous irregularity about the right bifurcation  without flow-limiting stenosis. Right ICA widely patent distally without stenosis, dissection or occlusion. Left carotid system: Left common carotid artery widely patent to the bifurcation without stenosis. Mild atheromatous irregularity about the left bifurcation/proximal left ICA without flow-limiting stenosis. Left ICA widely patent distally without stenosis, dissection or occlusion. Vertebral arteries: Both vertebral arteries arise from the subclavian arteries. Strongly dominant left vertebral artery with diffusely hypoplastic right vertebral artery noted. No proximal subclavian artery stenosis. Vertebral arteries widely patent within the neck without stenosis, dissection or occlusion. Skeleton: No acute osseous abnormality. No discrete or worrisome osseous lesions. Prominent bulky anterior osteophytic spurring noted anteriorly at the C1-2 levels. Other neck: No other acute soft tissue abnormality within the neck. No mass lesion or adenopathy. Few small subcentimeter thyroid nodules measuring up to 5 mm noted, felt to be of doubtful clinical significance given size and patient age. No follow-up imaging recommended. Upper chest: Visualized upper chest demonstrates no acute finding. Review of the MIP images confirms the above findings CTA HEAD FINDINGS Anterior circulation: Both internal carotid arteries widely patent to the termini without stenosis or other abnormality. A1 segments patent. Normal anterior communicating artery complex. Anterior cerebral arteries widely patent without stenosis. No M1 stenosis or occlusion. Normal MCA bifurcations. Distal MCA branches well perfused and symmetric. Posterior circulation: Dominant left vertebral artery patent to the vertebrobasilar junction without stenosis. Patent left PICA. Hypoplastic right vertebral artery largely terminates in PICA. Basilar diffusely diminutive but patent to its distal aspect without stenosis. Superior cerebral arteries patent bilaterally. Fetal  type origin of both PCAs supplied via a robust bilateral posterior communicating arteries. Both PCAs well perfused to their distal aspects. Venous sinuses: Patent. Anatomic variants: Fetal type origin of the PCAs with overall diminutive vertebrobasilar system. No intracranial aneurysm. Review of the MIP images confirms the above findings IMPRESSION: CT HEAD IMPRESSION: 1. No acute intracranial abnormality. 2. Mild chronic microvascular ischemic disease for age. CTA HEAD AND NECK IMPRESSION: 1. Negative CTA of the head and neck. No large vessel occlusion, hemodynamically significant stenosis, or other acute vascular abnormality. 2. Mild atheromatous irregularity about the carotid bifurcations without hemodynamically significant stenosis. 3. Fetal type origin of the PCAs with overall diminutive vertebrobasilar system. Electronically Signed   By: Jeannine Boga M.D.   On: 09/09/2019 00:21   MR BRAIN WO CONTRAST  Result Date: 09/08/2019 CLINICAL DATA:  Initial evaluation for acute nausea, vomiting, dizziness. EXAM: MRI HEAD WITHOUT CONTRAST TECHNIQUE: Multiplanar, multiecho pulse sequences of the brain and surrounding structures were obtained without intravenous contrast. COMPARISON:  None available. FINDINGS: Brain: Cerebral volume within normal limits for age. Scattered patchy T2/FLAIR signal abnormality seen within the periventricular and deep white matter both cerebral hemispheres as well as the pons, most consistent with chronic small vessel ischemic disease, mild in nature. No abnormal foci of restricted diffusion to suggest acute or subacute ischemia. Gray-white matter differentiation maintained. No encephalomalacia to suggest chronic cortical infarction. No foci of susceptibility artifact to suggest acute or chronic intracranial hemorrhage. No mass lesion, midline shift or mass effect. No hydrocephalus or  extra-axial fluid collection. 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 Midline structures intact.  Vascular: Major intracranial vascular flow voids are maintained. Skull and upper cervical spine: Craniocervical junction within normal limits. Bone marrow signal intensity normal. No scalp soft tissue abnormality. Sinuses/Orbits: Globes and orbital soft tissues within normal limits. Paranasal sinuses are largely clear. No mastoid effusion. Inner ear structures grossly normal. Other: None. IMPRESSION: 1. No acute intracranial abnormality. 2. Mild chronic microvascular ischemic disease for age. Electronically Signed   By: Jeannine Boga M.D.   On: 09/08/2019 23:24    Labs: BNP (last 3 results) No results for input(s): BNP in the last 8760 hours. Basic Metabolic Panel: Recent Labs  Lab 09/08/19 2117  NA 142  K 3.9  CL 105  CO2 26  GLUCOSE 138*  BUN 12  CREATININE 0.90  CALCIUM 9.5   Liver Function Tests: Recent Labs  Lab 09/08/19 2117  AST 28  ALT 23  ALKPHOS 89  BILITOT 0.6  PROT 8.5*  ALBUMIN 4.5   No results for input(s): LIPASE, AMYLASE in the last 168 hours. No results for input(s): AMMONIA in the last 168 hours. CBC: Recent Labs  Lab 09/08/19 2117  WBC 9.6  NEUTROABS 8.0*  HGB 14.5  HCT 44.0  MCV 93.4  PLT 226   Cardiac Enzymes: No results for input(s): CKTOTAL, CKMB, CKMBINDEX, TROPONINI in the last 168 hours. BNP: Invalid input(s): POCBNP CBG: No results for input(s): GLUCAP in the last 168 hours. D-Dimer No results for input(s): DDIMER in the last 72 hours. Hgb A1c No results for input(s): HGBA1C in the last 72 hours. Lipid Profile No results for input(s): CHOL, HDL, LDLCALC, TRIG, CHOLHDL, LDLDIRECT in the last 72 hours. Thyroid function studies No results for input(s): TSH, T4TOTAL, T3FREE, THYROIDAB in the last 72 hours.  Invalid input(s): FREET3 Anemia work up No results for input(s): VITAMINB12, FOLATE, FERRITIN, TIBC, IRON, RETICCTPCT in the last 72 hours. Urinalysis    Component Value Date/Time   COLORURINE STRAW (A) 09/08/2019 2117    APPEARANCEUR CLEAR 09/08/2019 2117   LABSPEC >1.046 (H) 09/08/2019 2117   PHURINE 7.0 09/08/2019 2117   GLUCOSEU NEGATIVE 09/08/2019 2117   HGBUR NEGATIVE 09/08/2019 2117   BILIRUBINUR NEGATIVE 09/08/2019 2117   KETONESUR 20 (A) 09/08/2019 2117   PROTEINUR NEGATIVE 09/08/2019 2117   NITRITE NEGATIVE 09/08/2019 2117   LEUKOCYTESUR NEGATIVE 09/08/2019 2117   Sepsis Labs Invalid input(s): PROCALCITONIN,  WBC,  LACTICIDVEN Microbiology No results found for this or any previous visit (from the past 240 hour(s)).   Time coordinating discharge: 25  minutes  SIGNED: Antonieta Pert, MD  [facility] 09/11/2019, 11:34 AM  If 7PM-7AM, please contact night-coverage [email or web address]

## 2019-09-11 NOTE — Discharge Instructions (Signed)

## 2019-09-11 NOTE — Progress Notes (Signed)
Discharge instructions reviewed with patient. Discussed follow up appt(s) and medication administration. She verbalizes understanding of discharge instructions.

## 2019-09-11 NOTE — TOC Transition Note (Signed)
Transition of Care [facility]) - CM/SW Discharge Note   Patient Details  Name: Alice Shannon MRN: [id number] Date of Birth: 1945-04-07  [facility] CM/SW Contact:  Trish Mage, LCSW Phone Number: 09/11/2019, 1:59 PM   Clinical Narrative:   Patient to transition home today. Tiffany with [facility] confirmed ability to provide vestibular PT for patient in home. No further needs identified.  TOC sign off.    Final next level of care: [place] Barriers to Discharge: No Barriers Identified   Patient Goals and CMS Choice        Discharge Placement                       Discharge Plan and Services                                     Social Determinants of Health (SDOH) Interventions     Readmission Risk Interventions No flowsheet data found.

## 2019-09-29 ENCOUNTER — Other Ambulatory Visit: Payer: Self-pay

## 2019-09-29 ENCOUNTER — Ambulatory Visit
Admission: RE | Admit: 2019-09-29 | Discharge: 2019-09-29 | Disposition: A | Discharge status: Home / Self Care | Payer: Medicare Other | Source: Ambulatory Visit | Attending: Internal Medicine | Admitting: Internal Medicine

## 2019-09-29 DIAGNOSIS — Z1231 Encounter for screening mammogram for malignant neoplasm of breast: Secondary | ICD-10-CM

## 2019-10-28 MED FILL — MECLIZINE HCL 25 MG TABS: 25 | 10 days supply | Qty: 20 | Fill #0

## 2020-05-10 ENCOUNTER — Other Ambulatory Visit (HOSPITAL_COMMUNITY): Payer: Self-pay | Admitting: Internal Medicine

## 2020-05-10 DIAGNOSIS — H8110 Benign paroxysmal vertigo, unspecified ear: Secondary | ICD-10-CM | POA: Diagnosis not present

## 2020-05-10 DIAGNOSIS — Z1389 Encounter for screening for other disorder: Secondary | ICD-10-CM | POA: Diagnosis not present

## 2020-05-10 DIAGNOSIS — J309 Allergic rhinitis, unspecified: Secondary | ICD-10-CM | POA: Diagnosis not present

## 2020-05-10 DIAGNOSIS — M85859 Other specified disorders of bone density and structure, unspecified thigh: Secondary | ICD-10-CM | POA: Diagnosis not present

## 2020-05-10 DIAGNOSIS — E559 Vitamin D deficiency, unspecified: Secondary | ICD-10-CM | POA: Diagnosis not present

## 2020-05-10 DIAGNOSIS — Z8679 Personal history of other diseases of the circulatory system: Secondary | ICD-10-CM | POA: Diagnosis not present

## 2020-05-10 DIAGNOSIS — Z23 Encounter for immunization: Secondary | ICD-10-CM | POA: Diagnosis not present

## 2020-05-10 DIAGNOSIS — E785 Hyperlipidemia, unspecified: Secondary | ICD-10-CM | POA: Diagnosis not present

## 2020-05-10 DIAGNOSIS — Z1159 Encounter for screening for other viral diseases: Secondary | ICD-10-CM | POA: Diagnosis not present

## 2020-05-10 DIAGNOSIS — Z0000 Encounter for general adult medical examination without abnormal findings: Secondary | ICD-10-CM | POA: Diagnosis not present

## 2020-05-10 MED FILL — MECLIZINE HCL 25 MG TABS: 25 | 10 days supply | Qty: 20 | Fill #0

## 2020-05-11 ENCOUNTER — Other Ambulatory Visit: Payer: Self-pay | Admitting: Internal Medicine

## 2020-05-11 DIAGNOSIS — Z1231 Encounter for screening mammogram for malignant neoplasm of breast: Secondary | ICD-10-CM

## 2020-05-27 DIAGNOSIS — H903 Sensorineural hearing loss, bilateral: Secondary | ICD-10-CM | POA: Diagnosis not present

## 2020-05-27 DIAGNOSIS — H7202 Central perforation of tympanic membrane, left ear: Secondary | ICD-10-CM | POA: Diagnosis not present

## 2020-06-10 DIAGNOSIS — Z23 Encounter for immunization: Secondary | ICD-10-CM | POA: Diagnosis not present

## 2020-09-30 ENCOUNTER — Other Ambulatory Visit: Payer: Self-pay

## 2020-09-30 ENCOUNTER — Ambulatory Visit
Admission: RE | Admit: 2020-09-30 | Discharge: 2020-09-30 | Disposition: A | Discharge status: Home / Self Care | Payer: Medicare Other | Source: Ambulatory Visit | Attending: Internal Medicine | Admitting: Internal Medicine

## 2020-09-30 DIAGNOSIS — Z1231 Encounter for screening mammogram for malignant neoplasm of breast: Secondary | ICD-10-CM

## 2020-10-04 ENCOUNTER — Other Ambulatory Visit: Payer: Self-pay | Admitting: Internal Medicine

## 2020-10-04 DIAGNOSIS — R928 Other abnormal and inconclusive findings on diagnostic imaging of breast: Secondary | ICD-10-CM

## 2020-10-09 ENCOUNTER — Ambulatory Visit
Admission: RE | Admit: 2020-10-09 | Discharge: 2020-10-09 | Disposition: A | Discharge status: Home / Self Care | Payer: Medicare Other | Source: Ambulatory Visit | Attending: Internal Medicine | Admitting: Internal Medicine

## 2020-10-09 ENCOUNTER — Other Ambulatory Visit: Payer: Self-pay

## 2020-10-09 ENCOUNTER — Other Ambulatory Visit: Payer: Self-pay | Admitting: Internal Medicine

## 2020-10-09 DIAGNOSIS — R921 Mammographic calcification found on diagnostic imaging of breast: Secondary | ICD-10-CM | POA: Diagnosis not present

## 2020-10-09 DIAGNOSIS — R922 Inconclusive mammogram: Secondary | ICD-10-CM | POA: Diagnosis not present

## 2020-10-09 DIAGNOSIS — R928 Other abnormal and inconclusive findings on diagnostic imaging of breast: Secondary | ICD-10-CM

## 2020-11-10 ENCOUNTER — Other Ambulatory Visit (HOSPITAL_COMMUNITY): Payer: Self-pay

## 2020-11-10 DIAGNOSIS — Z23 Encounter for immunization: Secondary | ICD-10-CM | POA: Diagnosis not present

## 2020-11-10 DIAGNOSIS — E785 Hyperlipidemia, unspecified: Secondary | ICD-10-CM | POA: Diagnosis not present

## 2020-11-10 DIAGNOSIS — R928 Other abnormal and inconclusive findings on diagnostic imaging of breast: Secondary | ICD-10-CM | POA: Diagnosis not present

## 2020-11-10 DIAGNOSIS — J309 Allergic rhinitis, unspecified: Secondary | ICD-10-CM | POA: Diagnosis not present

## 2020-11-10 DIAGNOSIS — H8110 Benign paroxysmal vertigo, unspecified ear: Secondary | ICD-10-CM | POA: Diagnosis not present

## 2020-11-10 DIAGNOSIS — M85859 Other specified disorders of bone density and structure, unspecified thigh: Secondary | ICD-10-CM | POA: Diagnosis not present

## 2020-11-10 MED ORDER — MECLIZINE HCL 25 MG PO TABS
ORAL_TABLET | ORAL | 1 refills | Status: AC
  Filled 2020-11-10: qty 20, 10d supply, fill #0

## 2020-11-11 ENCOUNTER — Other Ambulatory Visit: Payer: Self-pay | Admitting: Internal Medicine

## 2020-11-11 DIAGNOSIS — M8580 Other specified disorders of bone density and structure, unspecified site: Secondary | ICD-10-CM

## 2020-11-15 ENCOUNTER — Other Ambulatory Visit (HOSPITAL_COMMUNITY): Payer: Self-pay

## 2020-11-25 DIAGNOSIS — H903 Sensorineural hearing loss, bilateral: Secondary | ICD-10-CM | POA: Diagnosis not present

## 2020-11-25 DIAGNOSIS — H7202 Central perforation of tympanic membrane, left ear: Secondary | ICD-10-CM | POA: Diagnosis not present

## 2021-04-12 ENCOUNTER — Other Ambulatory Visit: Payer: Self-pay | Admitting: Internal Medicine

## 2021-04-12 ENCOUNTER — Ambulatory Visit
Admission: RE | Admit: 2021-04-12 | Discharge: 2021-04-12 | Disposition: A | Discharge status: Home / Self Care | Payer: Medicare Other | Source: Ambulatory Visit | Attending: Internal Medicine | Admitting: Internal Medicine

## 2021-04-12 DIAGNOSIS — R928 Other abnormal and inconclusive findings on diagnostic imaging of breast: Secondary | ICD-10-CM

## 2021-04-12 DIAGNOSIS — R922 Inconclusive mammogram: Secondary | ICD-10-CM | POA: Diagnosis not present

## 2021-04-12 DIAGNOSIS — R921 Mammographic calcification found on diagnostic imaging of breast: Secondary | ICD-10-CM

## 2021-05-02 ENCOUNTER — Ambulatory Visit
Admission: RE | Admit: 2021-05-02 | Discharge: 2021-05-02 | Disposition: A | Discharge status: Home / Self Care | Payer: Medicare Other | Source: Ambulatory Visit | Attending: Internal Medicine | Admitting: Internal Medicine

## 2021-05-02 DIAGNOSIS — Z780 Asymptomatic menopausal state: Secondary | ICD-10-CM | POA: Diagnosis not present

## 2021-05-02 DIAGNOSIS — M85852 Other specified disorders of bone density and structure, left thigh: Secondary | ICD-10-CM | POA: Diagnosis not present

## 2021-05-02 DIAGNOSIS — M8580 Other specified disorders of bone density and structure, unspecified site: Secondary | ICD-10-CM

## 2021-06-24 DIAGNOSIS — H8110 Benign paroxysmal vertigo, unspecified ear: Secondary | ICD-10-CM | POA: Diagnosis not present

## 2021-06-24 DIAGNOSIS — Z0000 Encounter for general adult medical examination without abnormal findings: Secondary | ICD-10-CM | POA: Diagnosis not present

## 2021-06-24 DIAGNOSIS — E785 Hyperlipidemia, unspecified: Secondary | ICD-10-CM | POA: Diagnosis not present

## 2021-06-24 DIAGNOSIS — Z1389 Encounter for screening for other disorder: Secondary | ICD-10-CM | POA: Diagnosis not present

## 2021-06-24 DIAGNOSIS — R928 Other abnormal and inconclusive findings on diagnostic imaging of breast: Secondary | ICD-10-CM | POA: Diagnosis not present

## 2021-06-24 DIAGNOSIS — Z833 Family history of diabetes mellitus: Secondary | ICD-10-CM | POA: Diagnosis not present

## 2021-06-24 DIAGNOSIS — R739 Hyperglycemia, unspecified: Secondary | ICD-10-CM | POA: Diagnosis not present

## 2021-06-28 DIAGNOSIS — H7202 Central perforation of tympanic membrane, left ear: Secondary | ICD-10-CM | POA: Diagnosis not present

## 2021-06-28 DIAGNOSIS — H903 Sensorineural hearing loss, bilateral: Secondary | ICD-10-CM | POA: Diagnosis not present

## 2021-10-11 ENCOUNTER — Ambulatory Visit
Admission: RE | Admit: 2021-10-11 | Discharge: 2021-10-11 | Disposition: A | Discharge status: Home / Self Care | Payer: Medicare Other | Source: Ambulatory Visit | Attending: Internal Medicine | Admitting: Internal Medicine

## 2021-10-11 DIAGNOSIS — R921 Mammographic calcification found on diagnostic imaging of breast: Secondary | ICD-10-CM

## 2021-12-22 DIAGNOSIS — J309 Allergic rhinitis, unspecified: Secondary | ICD-10-CM | POA: Diagnosis not present

## 2021-12-22 DIAGNOSIS — Z23 Encounter for immunization: Secondary | ICD-10-CM | POA: Diagnosis not present

## 2021-12-22 DIAGNOSIS — H8110 Benign paroxysmal vertigo, unspecified ear: Secondary | ICD-10-CM | POA: Diagnosis not present

## 2021-12-22 DIAGNOSIS — E785 Hyperlipidemia, unspecified: Secondary | ICD-10-CM | POA: Diagnosis not present

## 2022-01-03 ENCOUNTER — Other Ambulatory Visit (HOSPITAL_COMMUNITY): Payer: Self-pay

## 2022-01-03 MED ORDER — FLUTICASONE PROPIONATE 50 MCG/ACT NA SUSP
1.0000 | Freq: Every day | NASAL | 5 refills | Status: AC
  Filled 2022-01-03: qty 16, 60d supply, fill #0

## 2022-01-03 MED ORDER — MECLIZINE HCL 25 MG PO TABS
25.0000 mg | ORAL_TABLET | Freq: Two times a day (BID) | ORAL | 1 refills | Status: AC
  Filled 2022-01-03: qty 20, 10d supply, fill #0

## 2022-01-12 ENCOUNTER — Other Ambulatory Visit (HOSPITAL_COMMUNITY): Payer: Self-pay

## 2022-01-27 ENCOUNTER — Other Ambulatory Visit (HOSPITAL_COMMUNITY): Payer: Self-pay

## 2022-02-08 ENCOUNTER — Other Ambulatory Visit (HOSPITAL_COMMUNITY): Payer: Self-pay

## 2022-07-10 DIAGNOSIS — Z0000 Encounter for general adult medical examination without abnormal findings: Secondary | ICD-10-CM | POA: Diagnosis not present

## 2022-07-10 DIAGNOSIS — H9192 Unspecified hearing loss, left ear: Secondary | ICD-10-CM | POA: Diagnosis not present

## 2022-07-10 DIAGNOSIS — S8990XD Unspecified injury of unspecified lower leg, subsequent encounter: Secondary | ICD-10-CM | POA: Diagnosis not present

## 2022-07-10 DIAGNOSIS — R921 Mammographic calcification found on diagnostic imaging of breast: Secondary | ICD-10-CM | POA: Diagnosis not present

## 2022-07-10 DIAGNOSIS — E785 Hyperlipidemia, unspecified: Secondary | ICD-10-CM | POA: Diagnosis not present

## 2022-07-10 DIAGNOSIS — J309 Allergic rhinitis, unspecified: Secondary | ICD-10-CM | POA: Diagnosis not present

## 2022-07-20 ENCOUNTER — Other Ambulatory Visit: Payer: Self-pay | Admitting: Internal Medicine

## 2022-07-20 DIAGNOSIS — R921 Mammographic calcification found on diagnostic imaging of breast: Secondary | ICD-10-CM

## 2022-10-16 ENCOUNTER — Ambulatory Visit
Admission: RE | Admit: 2022-10-16 | Discharge: 2022-10-16 | Disposition: A | Discharge status: Home / Self Care | Payer: Medicare Other | Source: Ambulatory Visit | Attending: Internal Medicine | Admitting: Internal Medicine

## 2022-10-16 DIAGNOSIS — R921 Mammographic calcification found on diagnostic imaging of breast: Secondary | ICD-10-CM

## 2022-12-04 ENCOUNTER — Other Ambulatory Visit (HOSPITAL_COMMUNITY): Payer: Self-pay

## 2022-12-04 DIAGNOSIS — Z23 Encounter for immunization: Secondary | ICD-10-CM | POA: Diagnosis not present

## 2022-12-04 DIAGNOSIS — M25473 Effusion, unspecified ankle: Secondary | ICD-10-CM | POA: Diagnosis not present

## 2022-12-04 DIAGNOSIS — M25569 Pain in unspecified knee: Secondary | ICD-10-CM | POA: Diagnosis not present

## 2022-12-04 MED ORDER — MELOXICAM 7.5 MG PO TABS
7.5000 mg | ORAL_TABLET | Freq: Every day | ORAL | 0 refills | Status: AC | PRN
  Filled 2022-12-04: qty 30, 30d supply, fill #0

## 2022-12-05 ENCOUNTER — Encounter: Payer: Self-pay | Admitting: Internal Medicine

## 2022-12-06 ENCOUNTER — Other Ambulatory Visit: Payer: Self-pay | Admitting: Internal Medicine

## 2022-12-06 ENCOUNTER — Ambulatory Visit
Admission: RE | Admit: 2022-12-06 | Discharge: 2022-12-06 | Disposition: A | Discharge status: Home / Self Care | Payer: Medicare HMO | Source: Ambulatory Visit | Attending: Internal Medicine | Admitting: Internal Medicine

## 2022-12-06 DIAGNOSIS — M25472 Effusion, left ankle: Secondary | ICD-10-CM

## 2022-12-06 DIAGNOSIS — M25471 Effusion, right ankle: Secondary | ICD-10-CM

## 2022-12-06 DIAGNOSIS — M25562 Pain in left knee: Secondary | ICD-10-CM

## 2022-12-06 DIAGNOSIS — M25561 Pain in right knee: Secondary | ICD-10-CM

## 2022-12-06 DIAGNOSIS — M25572 Pain in left ankle and joints of left foot: Secondary | ICD-10-CM | POA: Diagnosis not present

## 2022-12-06 DIAGNOSIS — R600 Localized edema: Secondary | ICD-10-CM | POA: Diagnosis not present

## 2022-12-06 DIAGNOSIS — M1711 Unilateral primary osteoarthritis, right knee: Secondary | ICD-10-CM | POA: Diagnosis not present

## 2022-12-11 DIAGNOSIS — M1990 Unspecified osteoarthritis, unspecified site: Secondary | ICD-10-CM | POA: Diagnosis not present

## 2022-12-15 ENCOUNTER — Other Ambulatory Visit (HOSPITAL_COMMUNITY): Payer: Self-pay

## 2022-12-29 DIAGNOSIS — H5213 Myopia, bilateral: Secondary | ICD-10-CM | POA: Diagnosis not present

## 2023-03-21 ENCOUNTER — Other Ambulatory Visit (HOSPITAL_COMMUNITY): Payer: Self-pay

## 2023-04-11 DIAGNOSIS — H5213 Myopia, bilateral: Secondary | ICD-10-CM | POA: Diagnosis not present

## 2023-05-05 IMAGING — MG MM DIGITAL DIAGNOSTIC UNILAT*L* W/ TOMO W/ CAD
9 series · 9 of 21 positions shown · non-contrast
Comparison: Previous exam(s).

CLINICAL DATA: 75-year-old female presenting for six-month
follow-up of probably benign left breast calcifications.

EXAM:
DIGITAL DIAGNOSTIC UNILATERAL LEFT MAMMOGRAM WITH TOMOSYNTHESIS AND
CAD
TECHNIQUE: Left digital diagnostic mammography and breast tomosynthesis was
performed. The images were evaluated with computer-aided detection.

[L CC (1 of 2)]
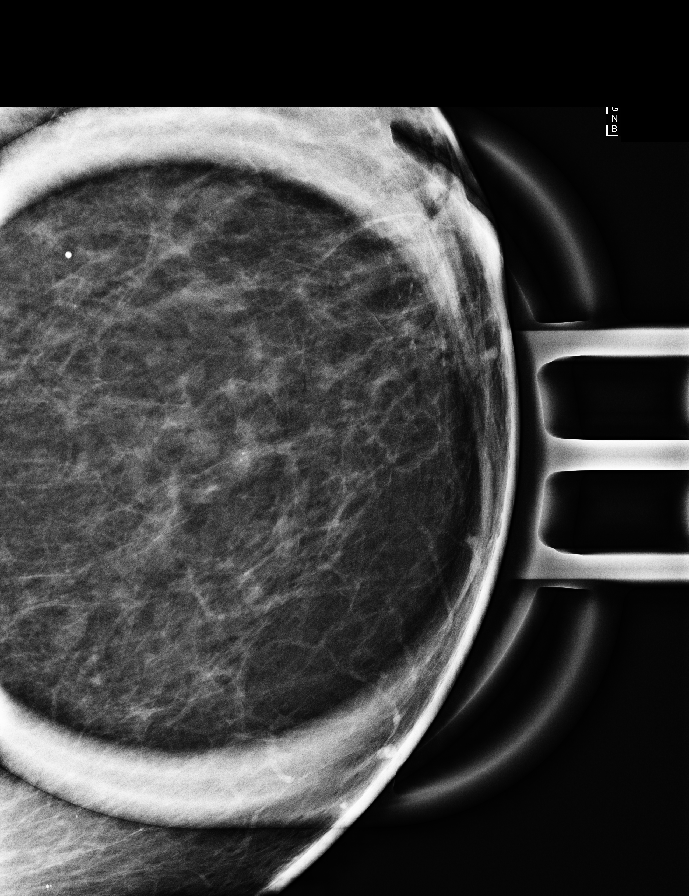

[L ML]
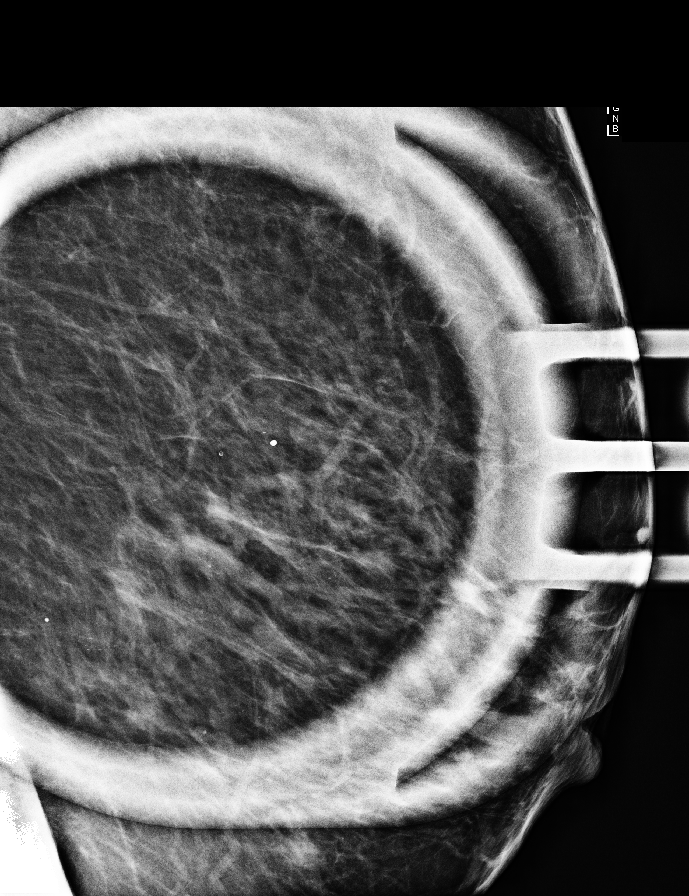

[L CC (2 of 2)]
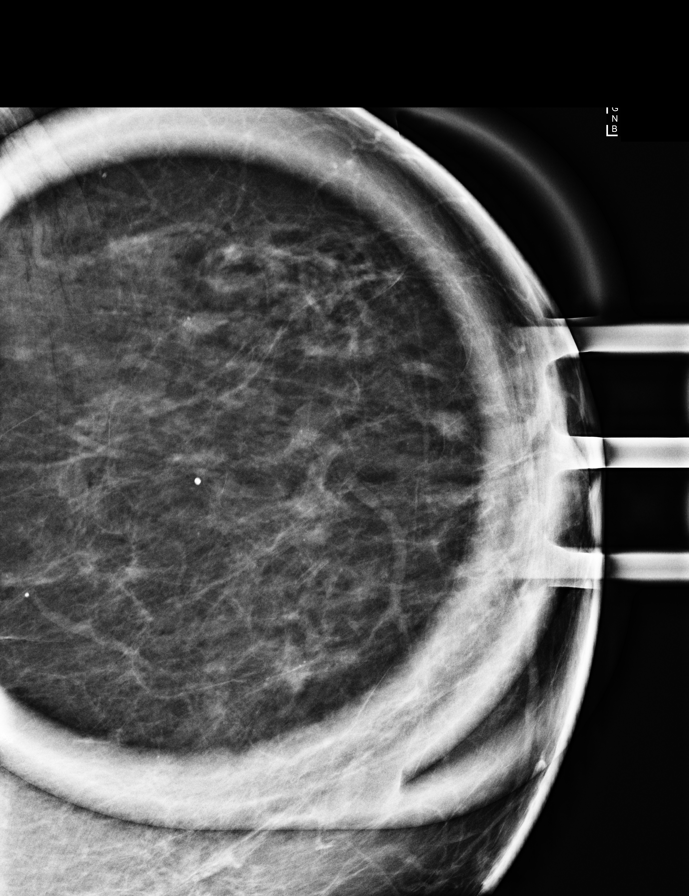

[L CC synth-2D]
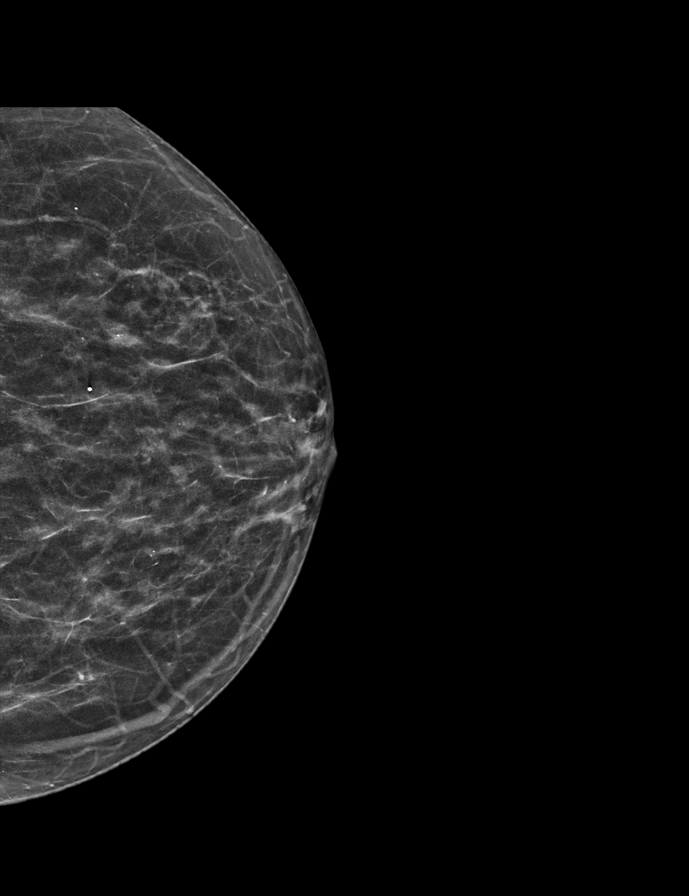

[L ML synth-2D (1 of 2)]
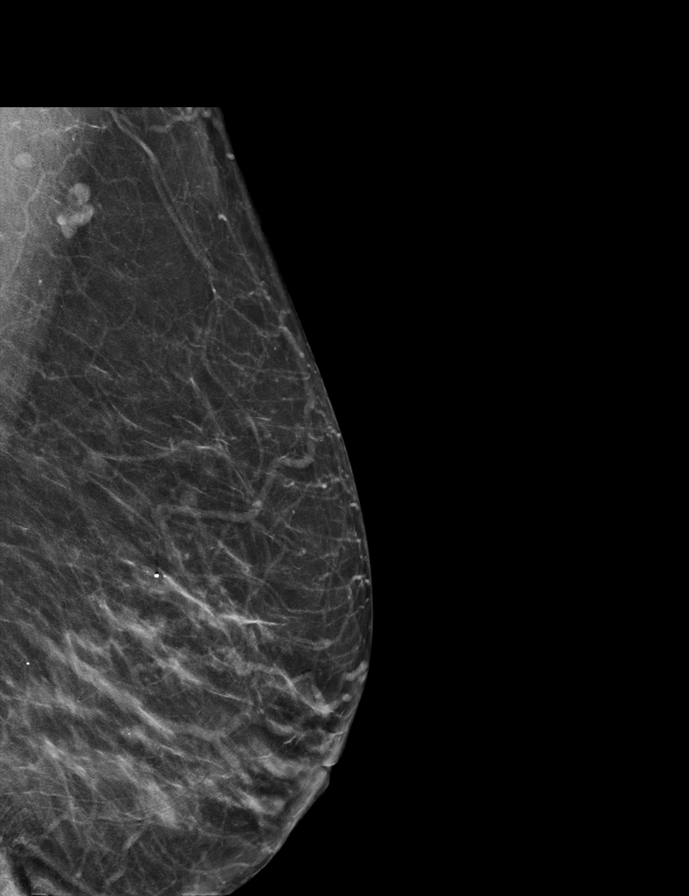

[L ML synth-2D (2 of 2)]
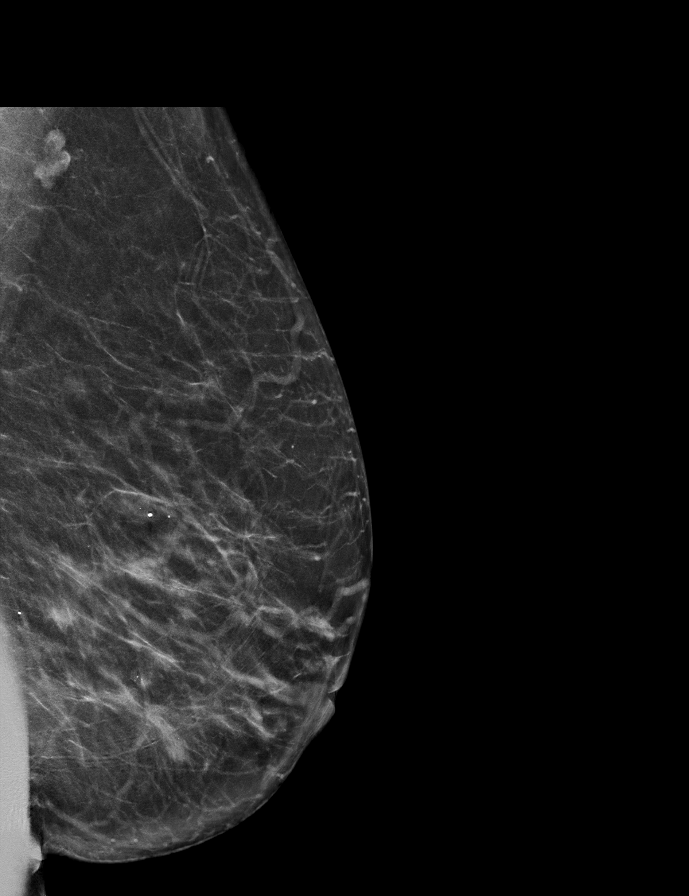

[L ML tomo (1 of 2) · tomo slice 33/66.0]
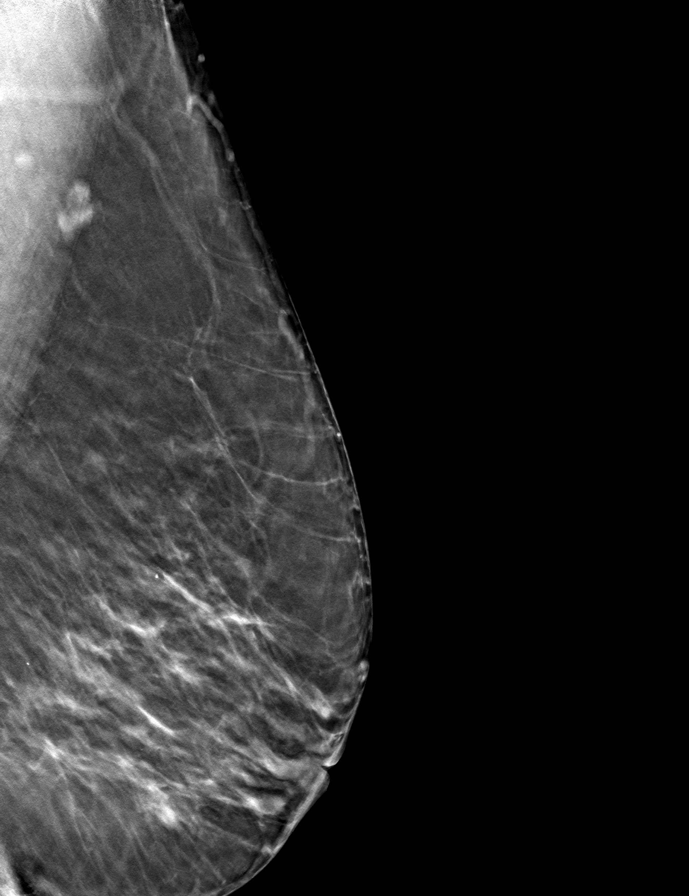

[L CC tomo · tomo slice 26/51.0]
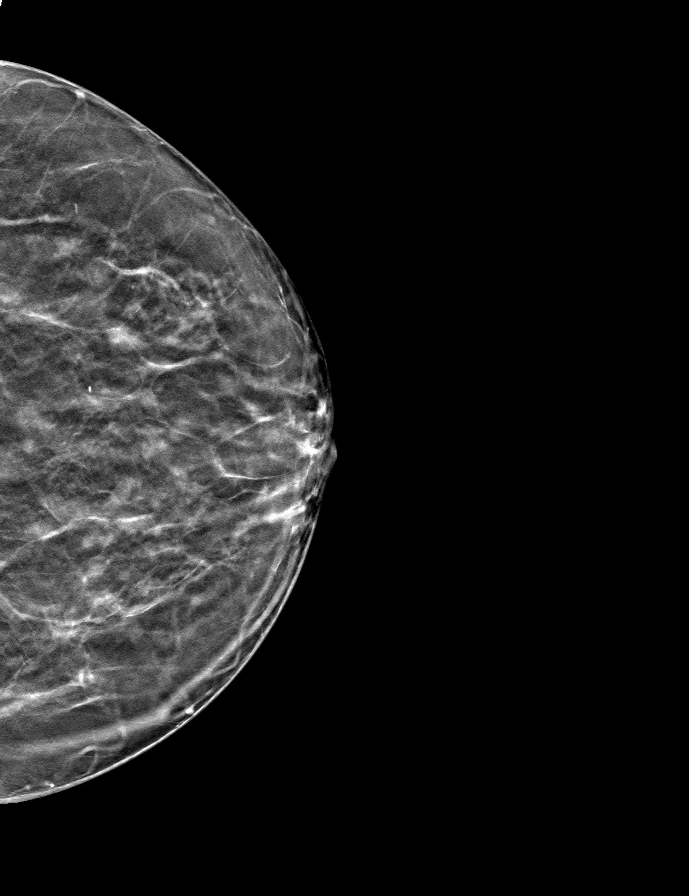

[L ML tomo (2 of 2) · tomo slice 33/66.0]
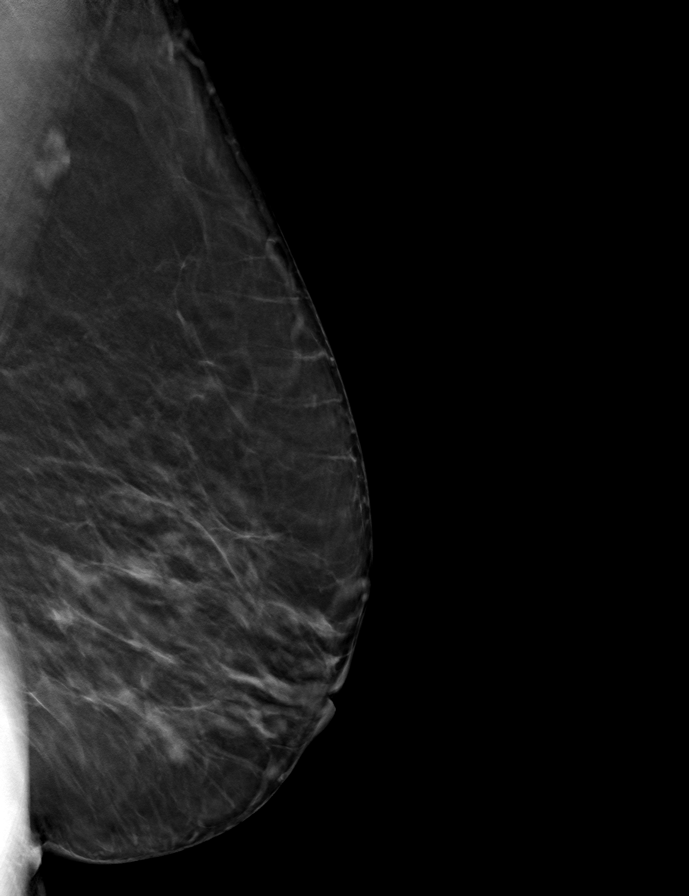

[9 of 21 positions shown; findings below may reference images not displayed]

ACR Breast Density Category b: There are scattered areas of
fibroglandular density.
FINDINGS: Spot 2D magnification views of the left breast were performed in
addition to standard views. There are stable grouped calcifications
in the central left breast posterior depth, some of which appear to
layer on lateral view. No new suspicious linear or branching forms.
No associated mass or distortion. There are no new suspicious
findings elsewhere in the left breast.
IMPRESSION: Stable probably benign grouped calcifications in the central left
breast.

RECOMMENDATION:
Diagnostic bilateral mammogram in 6 months.

I have discussed the findings and recommendations with the patient.
If applicable, a reminder letter will be sent to the patient
regarding the next appointment.

BI-RADS CATEGORY  3: Probably benign.

## 2023-05-24 DIAGNOSIS — Z008 Encounter for other general examination: Secondary | ICD-10-CM | POA: Diagnosis not present

## 2023-05-24 DIAGNOSIS — Z6826 Body mass index (BMI) 26.0-26.9, adult: Secondary | ICD-10-CM | POA: Diagnosis not present

## 2023-05-24 DIAGNOSIS — E663 Overweight: Secondary | ICD-10-CM | POA: Diagnosis not present

## 2023-05-24 DIAGNOSIS — E785 Hyperlipidemia, unspecified: Secondary | ICD-10-CM | POA: Diagnosis not present

## 2023-07-16 ENCOUNTER — Other Ambulatory Visit (HOSPITAL_COMMUNITY): Payer: Self-pay

## 2023-07-16 DIAGNOSIS — J309 Allergic rhinitis, unspecified: Secondary | ICD-10-CM | POA: Diagnosis not present

## 2023-07-16 DIAGNOSIS — S8990XD Unspecified injury of unspecified lower leg, subsequent encounter: Secondary | ICD-10-CM | POA: Diagnosis not present

## 2023-07-16 DIAGNOSIS — E785 Hyperlipidemia, unspecified: Secondary | ICD-10-CM | POA: Diagnosis not present

## 2023-07-16 DIAGNOSIS — I10 Essential (primary) hypertension: Secondary | ICD-10-CM | POA: Diagnosis not present

## 2023-07-16 DIAGNOSIS — H9192 Unspecified hearing loss, left ear: Secondary | ICD-10-CM | POA: Diagnosis not present

## 2023-07-16 DIAGNOSIS — Z0000 Encounter for general adult medical examination without abnormal findings: Secondary | ICD-10-CM | POA: Diagnosis not present

## 2023-07-16 DIAGNOSIS — M85859 Other specified disorders of bone density and structure, unspecified thigh: Secondary | ICD-10-CM | POA: Diagnosis not present

## 2023-07-16 DIAGNOSIS — Z1331 Encounter for screening for depression: Secondary | ICD-10-CM | POA: Diagnosis not present

## 2023-07-16 MED ORDER — AMLODIPINE BESYLATE 5 MG PO TABS
5.0000 mg | ORAL_TABLET | Freq: Every day | ORAL | 1 refills | Status: AC
  Filled 2023-07-16: qty 30, 30d supply, fill #0

## 2023-07-17 ENCOUNTER — Other Ambulatory Visit (HOSPITAL_COMMUNITY): Payer: Self-pay

## 2023-07-19 ENCOUNTER — Other Ambulatory Visit (HOSPITAL_BASED_OUTPATIENT_CLINIC_OR_DEPARTMENT_OTHER): Payer: Self-pay | Admitting: Internal Medicine

## 2023-07-19 DIAGNOSIS — M85859 Other specified disorders of bone density and structure, unspecified thigh: Secondary | ICD-10-CM

## 2023-07-25 ENCOUNTER — Ambulatory Visit (HOSPITAL_BASED_OUTPATIENT_CLINIC_OR_DEPARTMENT_OTHER)
Admission: RE | Admit: 2023-07-25 | Discharge: 2023-07-25 | Disposition: A | Discharge status: Home / Self Care | Source: Ambulatory Visit | Attending: Internal Medicine | Admitting: Internal Medicine

## 2023-07-25 DIAGNOSIS — Z13820 Encounter for screening for osteoporosis: Secondary | ICD-10-CM | POA: Insufficient documentation

## 2023-07-25 DIAGNOSIS — M85851 Other specified disorders of bone density and structure, right thigh: Secondary | ICD-10-CM | POA: Diagnosis not present

## 2023-07-25 DIAGNOSIS — M85859 Other specified disorders of bone density and structure, unspecified thigh: Secondary | ICD-10-CM | POA: Diagnosis present

## 2023-07-25 DIAGNOSIS — Z780 Asymptomatic menopausal state: Secondary | ICD-10-CM | POA: Diagnosis not present

## 2023-07-25 DIAGNOSIS — M8589 Other specified disorders of bone density and structure, multiple sites: Secondary | ICD-10-CM | POA: Diagnosis not present

## 2023-08-01 ENCOUNTER — Encounter (INDEPENDENT_AMBULATORY_CARE_PROVIDER_SITE_OTHER): Payer: Self-pay | Admitting: Otolaryngology

## 2023-08-01 ENCOUNTER — Ambulatory Visit (INDEPENDENT_AMBULATORY_CARE_PROVIDER_SITE_OTHER): Admitting: Otolaryngology

## 2023-08-01 VITALS — BP 135/82 | HR 90 | Ht 59.0 in | Wt 140.0 lb

## 2023-08-01 DIAGNOSIS — H918X3 Other specified hearing loss, bilateral: Secondary | ICD-10-CM | POA: Diagnosis not present

## 2023-08-01 DIAGNOSIS — H903 Sensorineural hearing loss, bilateral: Secondary | ICD-10-CM

## 2023-08-01 DIAGNOSIS — H9193 Unspecified hearing loss, bilateral: Secondary | ICD-10-CM

## 2023-08-01 DIAGNOSIS — H6122 Impacted cerumen, left ear: Secondary | ICD-10-CM | POA: Diagnosis not present

## 2023-08-02 DIAGNOSIS — H903 Sensorineural hearing loss, bilateral: Secondary | ICD-10-CM | POA: Insufficient documentation

## 2023-08-02 DIAGNOSIS — H6122 Impacted cerumen, left ear: Secondary | ICD-10-CM | POA: Insufficient documentation

## 2023-08-02 NOTE — Progress Notes (Signed)
 Patient ID: Alice Shannon, female   DOB: June 21, 1945, 78 y.o.   MRN: [id number]  Follow-up: Hearing loss  HPI: The patient is an 78 year old female who returns today for her follow-up evaluation.  The patient has a history of sudden left ear hearing loss. The patient underwent left chemical labyrinthotomy with dexamethasone infusions with no improvement in her left ear hearing.  She was fitted with a CROS hearing aid.  The patient returns today noting no acute change in her hearing. No otalgia, otorrhea, or vertigo is currently noted.   Exam: General: Communicates without difficulty, well nourished, no acute distress. Head: Normocephalic, no evidence injury, no tenderness, facial buttresses intact without stepoff. Eyes: PERRL, EOMI. No scleral icterus, conjunctivae clear. Neuro: CN II exam reveals vision grossly intact. No nystagmus at any point of gaze. Ears: Auricles well formed without lesions.  Left ear cerumen impaction.  The left tube has extruded into the ear canal, and is encased within the cerumen.  The right ear canal and tympanic membrane are normal.  Nose: External evaluation reveals normal support and skin without lesions. Dorsum is intact. Anterior rhinoscopy reveals healthy pink mucosa over anterior aspect of inferior turbinates and intact septum. No purulence noted. Oral:  Oral cavity and oropharynx are intact, symmetric, without erythema or edema. Mucosa is moist without lesions. Neck: Full range of motion without pain. There is no significant lymphadenopathy. No masses palpable. Thyroid bed within normal limits to palpation. Parotid glands and submandibular glands equal bilaterally without mass. Trachea is midline. Neuro:  CN 2-12 grossly intact. Gait normal. Vestibular: No nystagmus at any point of gaze. The cerebellar examination is unremarkable.   Procedure: Left ear cerumen disimpaction Anesthesia: None Description: Under the operating microscope, the cerumen is carefully  removed with a combination of cerumen currette, alligator forceps, and suction catheters.  The extruded tube is also removed.  After the cerumen is removed, the TMs are noted to be normal.  No mass, erythema, or lesions. The patient tolerated the procedure well.    Assessment: 1.  Left ear cerumen impaction.  The left ventilating tube has extruded in the ear canal, and is encased within the cerumen. 2.  The cerumen and tube removal procedure, both tympanic membranes and middle ear spaces are noted to be normal. 3.  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  Plan: 1.  Otomicroscopy with right ear cerumen and tube removal. 2.  The physical exam findings are reviewed with the patient. 3.  The patient is released from her dry ear precautions. 4.  The patient will return for reevaluation in 1 year.

## 2023-08-04 DIAGNOSIS — M1990 Unspecified osteoarthritis, unspecified site: Secondary | ICD-10-CM | POA: Diagnosis not present

## 2023-08-04 DIAGNOSIS — E785 Hyperlipidemia, unspecified: Secondary | ICD-10-CM | POA: Diagnosis not present

## 2023-08-20 ENCOUNTER — Other Ambulatory Visit (HOSPITAL_COMMUNITY): Payer: Self-pay

## 2023-08-20 DIAGNOSIS — I10 Essential (primary) hypertension: Secondary | ICD-10-CM | POA: Diagnosis not present

## 2023-08-20 DIAGNOSIS — E785 Hyperlipidemia, unspecified: Secondary | ICD-10-CM | POA: Diagnosis not present

## 2023-08-20 DIAGNOSIS — M85859 Other specified disorders of bone density and structure, unspecified thigh: Secondary | ICD-10-CM | POA: Diagnosis not present

## 2023-08-20 MED ORDER — AMLODIPINE BESYLATE 5 MG PO TABS
5.0000 mg | ORAL_TABLET | Freq: Every day | ORAL | 5 refills | Status: AC
  Filled 2023-08-20: qty 90, 90d supply, fill #0
  Filled 2024-02-01: qty 90, 90d supply, fill #1

## 2023-10-04 DIAGNOSIS — E785 Hyperlipidemia, unspecified: Secondary | ICD-10-CM | POA: Diagnosis not present

## 2023-10-04 DIAGNOSIS — M1990 Unspecified osteoarthritis, unspecified site: Secondary | ICD-10-CM | POA: Diagnosis not present

## 2023-11-04 DIAGNOSIS — M1990 Unspecified osteoarthritis, unspecified site: Secondary | ICD-10-CM | POA: Diagnosis not present

## 2023-11-04 DIAGNOSIS — E785 Hyperlipidemia, unspecified: Secondary | ICD-10-CM | POA: Diagnosis not present

## 2023-12-04 DIAGNOSIS — E785 Hyperlipidemia, unspecified: Secondary | ICD-10-CM | POA: Diagnosis not present

## 2023-12-04 DIAGNOSIS — M1990 Unspecified osteoarthritis, unspecified site: Secondary | ICD-10-CM | POA: Diagnosis not present

## 2024-02-01 ENCOUNTER — Other Ambulatory Visit (HOSPITAL_COMMUNITY): Payer: Self-pay

## 2024-02-18 ENCOUNTER — Other Ambulatory Visit (HOSPITAL_COMMUNITY): Payer: Self-pay

## 2024-02-18 MED ORDER — AMLODIPINE BESYLATE 5 MG PO TABS
5.0000 mg | ORAL_TABLET | Freq: Every day | ORAL | 5 refills | Status: AC
  Filled 2024-02-18: qty 90, 90d supply, fill #0

## 2024-02-19 ENCOUNTER — Other Ambulatory Visit (HOSPITAL_COMMUNITY): Payer: Self-pay

## 2024-02-20 ENCOUNTER — Other Ambulatory Visit (HOSPITAL_COMMUNITY): Payer: Self-pay
